# Patient Record
Sex: Male | Born: 1960 | Race: White | Hispanic: No | Marital: Married | Smoking: Former smoker
Health system: Southern US, Community
[De-identification: ages and names within clinical notes are randomized; demographics above are authoritative.]

## PROBLEM LIST (undated history)

## (undated) DIAGNOSIS — E7800 Pure hypercholesterolemia, unspecified: Secondary | ICD-10-CM

## (undated) DIAGNOSIS — E119 Type 2 diabetes mellitus without complications: Secondary | ICD-10-CM

## (undated) DIAGNOSIS — K219 Gastro-esophageal reflux disease without esophagitis: Secondary | ICD-10-CM

## (undated) DIAGNOSIS — I10 Essential (primary) hypertension: Secondary | ICD-10-CM

## (undated) HISTORY — PX: TOTAL HIP ARTHROPLASTY: SHX124

## (undated) HISTORY — DX: Essential (primary) hypertension: I10

## (undated) HISTORY — DX: Type 2 diabetes mellitus without complications: E11.9

## (undated) HISTORY — PX: SHOULDER ARTHROSCOPY WITH ROTATOR CUFF REPAIR: SHX5685

## (undated) HISTORY — DX: Pure hypercholesterolemia, unspecified: E78.00

## (undated) HISTORY — PX: WRIST FRACTURE SURGERY: SHX121

---

## 2008-08-14 ENCOUNTER — Emergency Department (HOSPITAL_COMMUNITY): Admission: EM | Admit: 2008-08-14 | Discharge: 2008-08-14 | Payer: Self-pay | Admitting: Emergency Medicine

## 2008-08-17 ENCOUNTER — Emergency Department (HOSPITAL_COMMUNITY): Admission: EM | Admit: 2008-08-17 | Discharge: 2008-08-17 | Payer: Self-pay | Admitting: Emergency Medicine

## 2008-09-21 ENCOUNTER — Emergency Department (HOSPITAL_COMMUNITY): Admission: EM | Admit: 2008-09-21 | Discharge: 2008-09-21 | Payer: Self-pay | Admitting: Emergency Medicine

## 2009-06-16 ENCOUNTER — Ambulatory Visit (HOSPITAL_COMMUNITY): Admission: RE | Admit: 2009-06-16 | Discharge: 2009-06-16 | Payer: Self-pay | Admitting: Internal Medicine

## 2009-06-20 ENCOUNTER — Ambulatory Visit (HOSPITAL_COMMUNITY): Admission: RE | Admit: 2009-06-20 | Discharge: 2009-06-20 | Payer: Self-pay | Admitting: Internal Medicine

## 2009-08-08 ENCOUNTER — Inpatient Hospital Stay (HOSPITAL_COMMUNITY)
Admission: RE | Admit: 2009-08-08 | Discharge: 2009-08-11 | Payer: Self-pay | Source: Other Acute Inpatient Hospital | Admitting: Orthopaedic Surgery

## 2010-04-11 ENCOUNTER — Emergency Department (HOSPITAL_COMMUNITY)
Admission: EM | Admit: 2010-04-11 | Discharge: 2010-04-11 | Payer: Self-pay | Source: Home / Self Care | Admitting: Emergency Medicine

## 2010-06-26 LAB — CBC
HCT: 30.7 % — ABNORMAL LOW (ref 39.0–52.0)
HCT: 44.8 % (ref 39.0–52.0)
Hemoglobin: 11.6 g/dL — ABNORMAL LOW (ref 13.0–17.0)
Hemoglobin: 11.9 g/dL — ABNORMAL LOW (ref 13.0–17.0)
MCHC: 34.5 g/dL (ref 30.0–36.0)
MCV: 98.1 fL (ref 78.0–100.0)
MCV: 98.5 fL (ref 78.0–100.0)
Platelets: 147 10*3/uL — ABNORMAL LOW (ref 150–400)
Platelets: 164 10*3/uL (ref 150–400)
Platelets: 213 10*3/uL (ref 150–400)
RBC: 3.42 MIL/uL — ABNORMAL LOW (ref 4.22–5.81)
RDW: 12.9 % (ref 11.5–15.5)
RDW: 13 % (ref 11.5–15.5)
WBC: 9.1 10*3/uL (ref 4.0–10.5)
WBC: 9.2 10*3/uL (ref 4.0–10.5)

## 2010-06-26 LAB — COMPREHENSIVE METABOLIC PANEL
AST: 25 U/L (ref 0–37)
Albumin: 4.1 g/dL (ref 3.5–5.2)
BUN: 21 mg/dL (ref 6–23)
Chloride: 108 mEq/L (ref 96–112)
Creatinine, Ser: 1 mg/dL (ref 0.4–1.5)
GFR calc Af Amer: >60 mL/min (ref >60)
Potassium: 4.2 mEq/L (ref 3.5–5.1)
Total Protein: 6.6 g/dL (ref 6.0–8.3)

## 2010-06-26 LAB — GLUCOSE, CAPILLARY
Glucose-Capillary: 104 mg/dL — ABNORMAL HIGH (ref 70–99)
Glucose-Capillary: 110 mg/dL — ABNORMAL HIGH (ref 70–99)
Glucose-Capillary: 124 mg/dL — ABNORMAL HIGH (ref 70–99)
Glucose-Capillary: 128 mg/dL — ABNORMAL HIGH (ref 70–99)
Glucose-Capillary: 141 mg/dL — ABNORMAL HIGH (ref 70–99)
Glucose-Capillary: 154 mg/dL — ABNORMAL HIGH (ref 70–99)
Glucose-Capillary: 97 mg/dL (ref 70–99)
Glucose-Capillary: 97 mg/dL (ref 70–99)

## 2010-06-26 LAB — APTT: aPTT: 29 seconds (ref 24–37)

## 2010-06-26 LAB — CROSSMATCH: Antibody Screen: NEGATIVE

## 2010-06-26 LAB — BASIC METABOLIC PANEL
BUN: 10 mg/dL (ref 6–23)
BUN: 15 mg/dL (ref 6–23)
BUN: 15 mg/dL (ref 6–23)
CO2: 27 mEq/L (ref 19–32)
CO2: 27 mEq/L (ref 19–32)
Calcium: 8.9 mg/dL (ref 8.4–10.5)
Chloride: 102 mEq/L (ref 96–112)
Creatinine, Ser: 0.91 mg/dL (ref 0.4–1.5)
Creatinine, Ser: 0.98 mg/dL (ref 0.4–1.5)
Creatinine, Ser: 1 mg/dL (ref 0.4–1.5)
GFR calc non Af Amer: >60 mL/min (ref >60)
GFR calc non Af Amer: >60 mL/min (ref >60)
Glucose, Bld: 112 mg/dL — ABNORMAL HIGH (ref 70–99)
Glucose, Bld: 129 mg/dL — ABNORMAL HIGH (ref 70–99)
Glucose, Bld: 95 mg/dL (ref 70–99)
Potassium: 3.8 mEq/L (ref 3.5–5.1)
Sodium: 135 mEq/L (ref 135–145)

## 2010-06-26 LAB — URINALYSIS, ROUTINE W REFLEX MICROSCOPIC
Glucose, UA: NEGATIVE mg/dL
Ketones, ur: 15 mg/dL — AB
Nitrite: NEGATIVE
Specific Gravity, Urine: 1.031 — ABNORMAL HIGH (ref 1.005–1.030)
pH: 5.5 (ref 5.0–8.0)

## 2010-06-26 LAB — URINE CULTURE: Colony Count: NO GROWTH

## 2010-06-26 LAB — PROTIME-INR
INR: 3.91 — ABNORMAL HIGH (ref 0.00–1.49)
Prothrombin Time: 15.7 seconds — ABNORMAL HIGH (ref 11.6–15.2)
Prothrombin Time: 33.6 seconds — ABNORMAL HIGH (ref 11.6–15.2)
Prothrombin Time: 38 seconds — ABNORMAL HIGH (ref 11.6–15.2)

## 2010-06-26 LAB — HEPATIC FUNCTION PANEL
Bilirubin, Direct: <0.1 mg/dL (ref 0.0–0.3)
Total Protein: 6 g/dL (ref 6.0–8.3)

## 2010-06-26 LAB — DIFFERENTIAL
Eosinophils Relative: 1 % (ref 0–5)
Lymphocytes Relative: 25 % (ref 12–46)
Monocytes Absolute: 0.7 10*3/uL (ref 0.1–1.0)
Monocytes Relative: 8 % (ref 3–12)
Neutro Abs: 6 10*3/uL (ref 1.7–7.7)

## 2010-07-16 LAB — URINALYSIS, ROUTINE W REFLEX MICROSCOPIC
Bilirubin Urine: NEGATIVE
Glucose, UA: NEGATIVE mg/dL
Ketones, ur: NEGATIVE mg/dL
Leukocytes, UA: NEGATIVE
Nitrite: NEGATIVE
Protein, ur: NEGATIVE mg/dL
Specific Gravity, Urine: 1.025 (ref 1.005–1.030)
Urobilinogen, UA: 0.2 mg/dL (ref 0.0–1.0)
pH: 5.5 (ref 5.0–8.0)

## 2010-07-16 LAB — URINE MICROSCOPIC-ADD ON

## 2013-01-12 ENCOUNTER — Telehealth: Payer: Self-pay

## 2013-01-12 NOTE — Telephone Encounter (Signed)
Pt was referred by Dr. Ouida Sills for screening colonoscopy. His mother had rectal cancer in her early 65's. OV with Florene Glen, NP on 02/03/2013 at 3:00 PM.

## 2013-02-03 ENCOUNTER — Ambulatory Visit (INDEPENDENT_AMBULATORY_CARE_PROVIDER_SITE_OTHER): Payer: Managed Care, Other (non HMO) | Admitting: Gastroenterology

## 2013-02-03 ENCOUNTER — Encounter (INDEPENDENT_AMBULATORY_CARE_PROVIDER_SITE_OTHER): Payer: Self-pay

## 2013-02-03 ENCOUNTER — Encounter: Payer: Self-pay | Admitting: Gastroenterology

## 2013-02-03 VITALS — BP 132/77 | HR 92 | Temp 97.2°F | Ht 68.0 in | Wt 206.2 lb

## 2013-02-03 DIAGNOSIS — Z1211 Encounter for screening for malignant neoplasm of colon: Secondary | ICD-10-CM

## 2013-02-03 DIAGNOSIS — Z800 Family history of malignant neoplasm of digestive organs: Secondary | ICD-10-CM

## 2013-02-03 MED ORDER — PEG 3350-KCL-NA BICARB-NACL 420 G PO SOLR: 4000.0000 mL | ORAL | Status: DC

## 2013-02-03 NOTE — Assessment & Plan Note (Signed)
52 year old with positive family history of colon cancer, mother diagnosed in her 71s. He has never had an initial high risk screening colonoscopy. No concerning lower or upper GI symptoms currently.   Proceed with colonoscopy with Dr. Darrick Penna in the near future. The risks, benefits, and alternatives have been discussed in detail with the patient. They state understanding and desire to proceed.

## 2013-02-03 NOTE — Patient Instructions (Signed)
We have scheduled you for a colonoscopy with Dr. Fields in the near future.  Further recommendations to follow once completed!   

## 2013-02-03 NOTE — Progress Notes (Addendum)
Primary Care Physician:  Carylon Perches, MD Primary Gastroenterologist:  Dr. Darrick Penna   Chief Complaint  Patient presents with  . Colonoscopy    HPI:   Matthew Mcdowell presents today for a visit prior to initial screening colonoscopy. He has a positive family history of colon cancer, with his mother diagnosed in her 44s. She is still living. Denies abdominal pain. Rare constipation. No rectal bleeding. No N/V. Rare reflux, will take wife's Prilosec as needed. Usually once every few weeks, specifically if eating a lot of spicy food. No dysphagia. Purposeful weight loss from the 2teens to low 200s. Around 13 pounds.   Past Medical History  Diagnosis Date  . Diabetes   . Hypertension   . Hypercholesterolemia     Past Surgical History  Procedure Laterality Date  . Total hip arthroplasty      right    Current Outpatient Prescriptions  Medication Sig Dispense Refill  . aspirin 81 MG tablet Take 81 mg by mouth daily.      Marland Kitchen lisinopril (PRINIVIL,ZESTRIL) 10 MG tablet Take 10 mg by mouth daily.       . metFORMIN (GLUCOPHAGE) 1000 MG tablet Take 1,000 mg by mouth 2 (two) times daily with a meal.       . pravastatin (PRAVACHOL) 40 MG tablet Take 40 mg by mouth daily.        No current facility-administered medications for this visit.    Allergies as of 02/03/2013  . (No Known Allergies)    Family History  Problem Relation Age of Onset  VULVAR CA RESULTING IN COLOSTOMY  Mother     Diagnosed in her 61s.  . Breast cancer Mother     History   Social History  . Marital Status: Married    Spouse Name: N/A    Number of Children: N/A  . Years of Education: N/A   Occupational History  . Not on file.   Social History Main Topics  . Smoking status: Never Smoker   . Smokeless tobacco: Not on file  . Alcohol Use: Yes     Comment: very little  . Drug Use: Yes     Comment: marijuana occasionally  . Sexual Activity: Not on file   Other Topics Concern  . Not on file   Social  History Narrative  . No narrative on file    Review of Systems: Gen: Denies any fever, chills, fatigue, weight loss, lack of appetite.  CV: Denies chest pain, heart palpitations, peripheral edema, syncope.  Resp: Denies shortness of breath at rest or with exertion. Denies wheezing or cough.  GI: see HPI GU : Denies urinary burning, urinary frequency, urinary hesitancy MS: Denies joint pain, muscle weakness, cramps, or limitation of movement.  Derm: Denies rash, itching, dry skin Psych: Denies depression, anxiety, memory loss, and confusion Heme: Denies bruising, bleeding, and enlarged lymph nodes.  Physical Exam: BP 132/77  Pulse 92  Temp(Src) 97.2 F (36.2 C) (Oral)  Ht 5\' 8"  (1.727 m)  Wt 206 lb 3.2 oz (93.532 kg)  BMI 31.36 kg/m2 General:   Alert and oriented. Pleasant and cooperative. Well-nourished and well-developed.  Head:  Normocephalic and atraumatic. Eyes:  Without icterus, sclera clear and conjunctiva pink.  Ears:  Normal auditory acuity. Nose:  No deformity, discharge,  or lesions. Mouth:  No deformity or lesions, oral mucosa pink.  Neck:  Supple, without mass or thyromegaly. Lungs:  Clear to auscultation bilaterally. No wheezes, rales, or rhonchi. No distress.  Heart:  S1,  S2 present without murmurs appreciated.  Abdomen:  +BS, soft, non-tender and non-distended. No HSM noted. No guarding or rebound. No masses appreciated.  Rectal:  Deferred  Msk:  Symmetrical without gross deformities. Normal posture. Extremities:  Without clubbing or edema. Neurologic:  Alert and  oriented x4;  grossly normal neurologically. Skin:  Intact without significant lesions or rashes. Cervical Nodes:  No significant cervical adenopathy. Psych:  Alert and cooperative. Normal mood and affect.

## 2013-02-08 NOTE — Progress Notes (Signed)
cc'd to pcp 

## 2013-02-23 ENCOUNTER — Encounter (HOSPITAL_COMMUNITY): Payer: Self-pay

## 2013-03-08 ENCOUNTER — Encounter (HOSPITAL_COMMUNITY): Payer: Self-pay | Admitting: *Deleted

## 2013-03-08 ENCOUNTER — Encounter (HOSPITAL_COMMUNITY)
Admission: RE | Disposition: A | Discharge status: Home / Self Care | Payer: Self-pay | Source: Ambulatory Visit | Attending: Gastroenterology

## 2013-03-08 ENCOUNTER — Ambulatory Visit (HOSPITAL_COMMUNITY)
Admission: RE | Admit: 2013-03-08 | Discharge: 2013-03-08 | Disposition: A | Discharge status: Home / Self Care | Payer: Managed Care, Other (non HMO) | Source: Ambulatory Visit | Attending: Gastroenterology | Admitting: Gastroenterology

## 2013-03-08 DIAGNOSIS — D126 Benign neoplasm of colon, unspecified: Secondary | ICD-10-CM

## 2013-03-08 DIAGNOSIS — Z1211 Encounter for screening for malignant neoplasm of colon: Secondary | ICD-10-CM

## 2013-03-08 DIAGNOSIS — K648 Other hemorrhoids: Secondary | ICD-10-CM

## 2013-03-08 DIAGNOSIS — Z01812 Encounter for preprocedural laboratory examination: Secondary | ICD-10-CM | POA: Insufficient documentation

## 2013-03-08 DIAGNOSIS — E7800 Pure hypercholesterolemia, unspecified: Secondary | ICD-10-CM | POA: Insufficient documentation

## 2013-03-08 DIAGNOSIS — E119 Type 2 diabetes mellitus without complications: Secondary | ICD-10-CM | POA: Insufficient documentation

## 2013-03-08 DIAGNOSIS — K5730 Diverticulosis of large intestine without perforation or abscess without bleeding: Secondary | ICD-10-CM

## 2013-03-08 DIAGNOSIS — Z800 Family history of malignant neoplasm of digestive organs: Secondary | ICD-10-CM

## 2013-03-08 DIAGNOSIS — K620 Anal polyp: Secondary | ICD-10-CM | POA: Insufficient documentation

## 2013-03-08 DIAGNOSIS — Z7982 Long term (current) use of aspirin: Secondary | ICD-10-CM | POA: Insufficient documentation

## 2013-03-08 DIAGNOSIS — I10 Essential (primary) hypertension: Secondary | ICD-10-CM | POA: Insufficient documentation

## 2013-03-08 HISTORY — PX: COLONOSCOPY: SHX5424

## 2013-03-08 SURGERY — COLONOSCOPY
Anesthesia: Moderate Sedation

## 2013-03-08 MED ORDER — MEPERIDINE HCL 100 MG/ML IJ SOLN
INTRAMUSCULAR | Status: AC
  Filled 2013-03-08: qty 2

## 2013-03-08 MED ORDER — SODIUM CHLORIDE 0.9 % IV SOLN
INTRAVENOUS | Status: DC
  Administered 2013-03-08: 09:00:00 via INTRAVENOUS

## 2013-03-08 MED ORDER — STERILE WATER FOR IRRIGATION IR SOLN
Status: DC | PRN
  Administered 2013-03-08: 09:00:00

## 2013-03-08 MED ORDER — MIDAZOLAM HCL 5 MG/5ML IJ SOLN
INTRAMUSCULAR | Status: DC | PRN
  Administered 2013-03-08: 2 mg via INTRAVENOUS
  Administered 2013-03-08: 1 mg via INTRAVENOUS
  Administered 2013-03-08: 2 mg via INTRAVENOUS

## 2013-03-08 MED ORDER — MIDAZOLAM HCL 5 MG/5ML IJ SOLN
INTRAMUSCULAR | Status: AC
  Filled 2013-03-08: qty 10

## 2013-03-08 MED ORDER — MEPERIDINE HCL 100 MG/ML IJ SOLN
INTRAMUSCULAR | Status: DC | PRN
  Administered 2013-03-08 (×3): 25 mg via INTRAVENOUS

## 2013-03-08 NOTE — Progress Notes (Signed)
REVIEWED.  

## 2013-03-08 NOTE — H&P (Addendum)
  Primary Care Physician:  Carylon Perches, MD Primary Gastroenterologist:  Dr. Darrick Penna  Pre-Procedure History & Physical: HPI:  Matthew Mcdowell is a 52 y.o. male here for COLON CANCER SCREENING: mother had VUVLAR CA AT age 54.  Past Medical History  Diagnosis Date  . Diabetes   . Hypertension   . Hypercholesterolemia    Past Surgical History  Procedure Laterality Date  . Total hip arthroplasty      right   Prior to Admission medications   Medication Sig Start Date End Date Taking? Authorizing Provider  aspirin 81 MG tablet Take 81 mg by mouth daily.   Yes Historical Provider, MD  lisinopril (PRINIVIL,ZESTRIL) 10 MG tablet Take 10 mg by mouth daily.  12/24/12  Yes Historical Provider, MD  metFORMIN (GLUCOPHAGE) 1000 MG tablet Take 1,000 mg by mouth 2 (two) times daily with a meal.  11/08/12  Yes Historical Provider, MD  pravastatin (PRAVACHOL) 40 MG tablet Take 40 mg by mouth daily.  10/30/12  Yes Historical Provider, MD   Allergies as of 02/03/2013  . (No Known Allergies)   Family History  Problem Relation Age of Onset  . Colon cancer Mother     Diagnosed in her 14s.  . Breast cancer Mother    History   Social History  . Marital Status: Married    Spouse Name: N/A    Number of Children: N/A  . Years of Education: N/A   Occupational History  . Not on file.   Social History Main Topics  . Smoking status: Never Smoker   . Smokeless tobacco: Not on file  . Alcohol Use: Yes     Comment: very little  . Drug Use: Yes     Comment: marijuana occasionally  . Sexual Activity: Not on file   Other Topics Concern  . Not on file   Social History Narrative  . No narrative on file   Review of Systems: See HPI, otherwise negative ROS  Physical Exam: BP 107/72  Pulse 83  Temp(Src) 98.5 F (36.9 C) (Oral)  Resp 18  SpO2 95% General:   Alert,  pleasant and cooperative in NAD Head:  Normocephalic and atraumatic. Neck:  Supple; Lungs:  Clear throughout to auscultation.     Heart:  Regular rate and rhythm. Abdomen:  Soft, nontender and nondistended. Normal bowel sounds, without guarding, and without rebound.   Neurologic:  Alert and  oriented x4;  grossly normal neurologically.  Impression/Plan:     SCREENING  Plan:  1. TCS TODAY

## 2013-03-08 NOTE — Op Note (Signed)
[facility] [address] [place], [address]   COLONOSCOPY PROCEDURE REPORT  PATIENT: Matthew Mcdowell, Matthew Mcdowell  MR#: [id number] BIRTHDATE: Oct 07, 1960 , 52  yrs. old GENDER: Male ENDOSCOPIST: Jonette Eva, MD REFERRED BY:Roy Ouida Sills, M.D. PROCEDURE DATE:  03/08/2013 PROCEDURE:   Colonoscopy with cold biopsy polypectomy INDICATIONS:Average risk patient for colon cancer.  PT'S MOTHER HAD VULVAR CA AT AGE 51 RESULTING IN A COLOSTOMY. MEDICATIONS: Demerol 75 mg IV and Versed 5 mg IV  DESCRIPTION OF PROCEDURE:    Physical exam was performed.  Informed consent was obtained from the patient after explaining the benefits, risks, and alternatives to procedure.  The patient was connected to monitor and placed in left lateral position. Continuous oxygen was provided by nasal cannula and IV medicine administered through an indwelling cannula.  After administration of sedation and rectal exam, the patients rectum was intubated and the EC-[id number] ([id number])  colonoscope was advanced under direct visualization to the ileum.  The scope was removed slowly by carefully examining the color, texture, anatomy, and integrity mucosa on the way out.  The patient was recovered in endoscopy and discharged home in satisfactory condition.    COLON FINDINGS: The mucosa appeared normal in the terminal ileum.  , Mild diverticulosis was noted in the ascending colon, descending colon, and sigmoid colon.  , Four sessile polyps measuring 2-4 mm in size were found in the sigmoid colon and rectum.  A polypectomy was performed with cold forceps. Small internal hemorrhoids were found.  PREP QUALITY: good.  CECAL W/D TIME: 12 minutes     COMPLICATIONS: None  ENDOSCOPIC IMPRESSION: 1.   Normal mucosa in the terminal ileum 2.   Mild diverticulosis n the ascending colon, descending colon, and sigmoid colon 3.   Four COLON  polyps REMOVED 4.   Small internal hemorrhoids  RECOMMENDATIONS: AWAIT BIOPSY HIGH  FIBER DIET TCS IN 5-10 YEARS IF SIMPLE ADENOMA AND 10 YEARS IF HYPERPLASTIC POLYPS       _______________________________ Rosalie DoctorJonette Eva, MD 03/08/2013 9:52 AM

## 2013-03-09 ENCOUNTER — Encounter (HOSPITAL_COMMUNITY): Payer: Self-pay | Admitting: Gastroenterology

## 2013-03-16 ENCOUNTER — Telehealth: Payer: Self-pay | Admitting: Gastroenterology

## 2013-03-16 NOTE — Telephone Encounter (Signed)
Please call pt. He had HYPERPLASTIC POLYPS removed from HIS colon/RECTUM.   FOLLOW A HIGH FIBER DIET. AVOID ITEMS THAT CAUSE BLOATING.   Next colonoscopy in 10 years.

## 2013-03-16 NOTE — Telephone Encounter (Signed)
LM for pt to call

## 2013-03-17 NOTE — Telephone Encounter (Signed)
Pt returned call and was informed of results.  

## 2013-09-08 ENCOUNTER — Ambulatory Visit (INDEPENDENT_AMBULATORY_CARE_PROVIDER_SITE_OTHER): Payer: BC Managed Care – PPO | Admitting: Family Medicine

## 2013-09-08 VITALS — BP 122/84 | HR 86 | Temp 98.2°F | Resp 16 | Ht 67.0 in | Wt 200.6 lb

## 2013-09-08 DIAGNOSIS — R5383 Other fatigue: Secondary | ICD-10-CM

## 2013-09-08 DIAGNOSIS — R5381 Other malaise: Secondary | ICD-10-CM

## 2013-09-08 DIAGNOSIS — T148 Other injury of unspecified body region: Secondary | ICD-10-CM

## 2013-09-08 DIAGNOSIS — W57XXXA Bitten or stung by nonvenomous insect and other nonvenomous arthropods, initial encounter: Secondary | ICD-10-CM

## 2013-09-08 MED ORDER — DOXYCYCLINE HYCLATE 100 MG PO CAPS: 100.0000 mg | ORAL_CAPSULE | Freq: Two times a day (BID) | ORAL | Status: DC

## 2013-09-08 NOTE — Progress Notes (Signed)
[facility] [address] [place] [address] [phone]  Date:  09/08/2013   Name:  Matthew Mcdowell   DOB:  August 02, 1960   MRN:  [id number]  PCP:  [name], MD    Chief Complaint: Tick Removal   History of Present Illness:  Matthew Mcdowell is a 53 y.o. very pleasant male patient who presents with the following:  He is here today regarding a tick bite.  About one week ago he noted a tick on his groin area- it was not yet swollen with blood. He pulled the tick out- he is not quite sure if a deer tick or dog tick.  He is here today because he has "not been feeling great for the last couple of days."  He has noted some aches, feeling hot and cold. He worried that this might mean he has a tick borne illness.   No antipyretics today.   He did vomit yesterday and today- once each.  His stomach feels "queasy." No diarrhea.  He does not have abdomianl pain.    History of hip replacement.  He sees Dr. Willey Blade regularly for his DM- he follows up every 3- 4 months.  Just had a CPE recetnly.  His recent DM labs have looked good per his report .     Patient Active Problem List   Diagnosis Date Noted  . Family history of colon cancer 02/03/2013    Past Medical History  Diagnosis Date  . Diabetes   . Hypertension   . Hypercholesterolemia     Past Surgical History  Procedure Laterality Date  . Total hip arthroplasty      right  . Colonoscopy N/A 03/08/2013    Procedure: COLONOSCOPY;  Surgeon: Danie Binder, MD;  Location: [facility];  Service: Endoscopy;  Laterality: N/A;  9:30    History  Substance Use Topics  . Smoking status: Never Smoker   . Smokeless tobacco: Not on file  . Alcohol Use: Yes     Comment: very little    Family History  Problem Relation Age of Onset  . Cancer Mother 71    VULVAR  . Breast cancer Mother     No Known Allergies  Medication list has been reviewed and updated.  Current Outpatient Prescriptions on File Prior to Visit   Medication Sig Dispense Refill  . aspirin 81 MG tablet Take 81 mg by mouth daily.      Marland Kitchen lisinopril (PRINIVIL,ZESTRIL) 10 MG tablet Take 10 mg by mouth daily.       . metFORMIN (GLUCOPHAGE) 1000 MG tablet Take 1,000 mg by mouth 2 (two) times daily with a meal.       . pravastatin (PRAVACHOL) 40 MG tablet Take 40 mg by mouth daily.        No current facility-administered medications on file prior to visit.    Review of Systems:  As per HPI- otherwise negative.   Physical Examination: Filed Vitals:   09/08/13 0920  BP: 122/84  Pulse: 86  Temp: 98.2 F (36.8 C)  Resp: 16   Filed Vitals:   09/08/13 0920  Height: 5\' 7"  (1.702 m)  Weight: 200 lb 9.6 oz (90.992 kg)   Body mass index is 31.41 kg/(m^2). Ideal Body Weight: Weight in (lb) to have BMI = 25: 159.3  GEN: WDWN, NAD, Non-toxic, A & O x 3, overweight, looks well otherwise  HEENT: Atraumatic, Normocephalic. Neck supple. No masses, No LAD. Ears and Nose: No external  deformity. CV: RRR, No M/G/R. No JVD. No thrill. No extra heart sounds. PULM: CTA B, no wheezes, crackles, rhonchi. No retractions. No resp. distress. No accessory muscle use. ABD: S, NT, ND No rebound. No HSM.  Benign exam EXTR: No c/c/e NEURO Normal gait.  PSYCH: Normally interactive. Conversant. Not depressed or anxious appearing.  Calm demeanor.  Right groin: there is a small red area surrounding a recent tick bite.  Appears to be a localized reaction only.   No other rash, checked hands and feet  Assessment and Plan: Tick bite - Plan: doxycycline (VIBRAMYCIN) 100 MG capsule  Other malaise and fatigue  Recent tick bite.  He is worried that he may have early signs of RMSF.  Will go ahead and cover with doxycycline.   See patient instructions for more details.     Signed Lamar Blinks, MD

## 2013-09-08 NOTE — Patient Instructions (Signed)
Ewe are going to treat you with doxycycline today to cover for possible [facility] Spotted Fever. Please keep an eye on your temperature and take the doxycycline twice a day. Assuming you feel well and do not have a fever you can stop this after a week. If you are getting worse, have fevers or any other problems please let me know Avoid excessive sun while on the doxycycline!

## 2013-09-10 ENCOUNTER — Telehealth: Payer: Self-pay

## 2013-09-10 NOTE — Telephone Encounter (Signed)
Please re-fax note, [phone]

## 2013-09-10 NOTE — Telephone Encounter (Signed)
Ok to extend note to cover today.  If patient not able to return to work after this, will need RTC for re-evaluation.

## 2013-09-10 NOTE — Telephone Encounter (Signed)
Patient was seen by Dr Lorelei Pont recently for tick bite. Received out of work note for Wednesday and Thursday but he is still not feeling better and wants a note for today (Friday). Can he get another note? Cb# [phone]

## 2013-09-10 NOTE — Telephone Encounter (Signed)
Letter has been written and faxed. Pt notified

## 2013-09-10 NOTE — Telephone Encounter (Signed)
Would like note faxed to [phone]

## 2015-04-17 ENCOUNTER — Emergency Department (HOSPITAL_COMMUNITY)
Admission: EM | Admit: 2015-04-17 | Discharge: 2015-04-17 | Disposition: A | Discharge status: Home / Self Care | Payer: Worker's Compensation | Attending: Emergency Medicine | Admitting: Emergency Medicine

## 2015-04-17 ENCOUNTER — Encounter (HOSPITAL_COMMUNITY): Payer: Self-pay | Admitting: Emergency Medicine

## 2015-04-17 DIAGNOSIS — Y9389 Activity, other specified: Secondary | ICD-10-CM | POA: Insufficient documentation

## 2015-04-17 DIAGNOSIS — S39012A Strain of muscle, fascia and tendon of lower back, initial encounter: Secondary | ICD-10-CM | POA: Diagnosis not present

## 2015-04-17 DIAGNOSIS — Z7982 Long term (current) use of aspirin: Secondary | ICD-10-CM | POA: Diagnosis not present

## 2015-04-17 DIAGNOSIS — Y990 Civilian activity done for income or pay: Secondary | ICD-10-CM | POA: Diagnosis not present

## 2015-04-17 DIAGNOSIS — X500XXA Overexertion from strenuous movement or load, initial encounter: Secondary | ICD-10-CM | POA: Insufficient documentation

## 2015-04-17 DIAGNOSIS — E119 Type 2 diabetes mellitus without complications: Secondary | ICD-10-CM | POA: Insufficient documentation

## 2015-04-17 DIAGNOSIS — E7800 Pure hypercholesterolemia, unspecified: Secondary | ICD-10-CM | POA: Insufficient documentation

## 2015-04-17 DIAGNOSIS — Y9289 Other specified places as the place of occurrence of the external cause: Secondary | ICD-10-CM | POA: Insufficient documentation

## 2015-04-17 DIAGNOSIS — S3992XA Unspecified injury of lower back, initial encounter: Secondary | ICD-10-CM | POA: Diagnosis present

## 2015-04-17 DIAGNOSIS — Z79899 Other long term (current) drug therapy: Secondary | ICD-10-CM | POA: Diagnosis not present

## 2015-04-17 DIAGNOSIS — I10 Essential (primary) hypertension: Secondary | ICD-10-CM | POA: Insufficient documentation

## 2015-04-17 MED ORDER — NAPROXEN 375 MG PO TABS: 375.0000 mg | ORAL_TABLET | Freq: Two times a day (BID) | ORAL | Status: DC

## 2015-04-17 MED ORDER — KETOROLAC TROMETHAMINE 60 MG/2ML IM SOLN
60.0000 mg | Freq: Once | INTRAMUSCULAR | Status: AC
  Administered 2015-04-17: 60 mg via INTRAMUSCULAR
  Filled 2015-04-17: qty 2

## 2015-04-17 MED ORDER — DIAZEPAM 5 MG PO TABS
10.0000 mg | ORAL_TABLET | Freq: Once | ORAL | Status: AC
  Administered 2015-04-17: 10 mg via ORAL
  Filled 2015-04-17: qty 2

## 2015-04-17 MED ORDER — DIAZEPAM 5 MG PO TABS: 5.0000 mg | ORAL_TABLET | Freq: Two times a day (BID) | ORAL | Status: DC

## 2015-04-17 NOTE — Discharge Instructions (Signed)
If you were given medicines take as directed.  If you are on coumadin or contraceptives realize their levels and effectiveness is altered by many different medicines.  If you have any reaction (rash, tongues swelling, other) to the medicines stop taking and see a physician.    If your blood pressure was elevated in the ER make sure you follow up for management with a primary doctor or return for chest pain, shortness of breath or stroke symptoms.  Please follow up as directed and return to the ER or see a physician for new or worsening symptoms.  Thank you. Filed Vitals:   04/17/15 0841  BP: 128/85  Pulse: 83  Temp: 97.5 F (36.4 C)  TempSrc: Oral  Resp: 20  Height: 5\' 8"  (1.727 m)  Weight: 207 lb (93.895 kg)  SpO2: 95%

## 2015-04-17 NOTE — ED Notes (Addendum)
Pt reports mid/lower back pain since Friday after throwing out rock salt.  Pt reports he was passing bags of salt along a line.  Pt has discomfort, denies any urinary symptoms, denies numbness tingling.

## 2015-04-17 NOTE — ED Provider Notes (Signed)
CSN: [id number]     Arrival date & time 04/17/15  [id number] History  By signing my name below, I, Emmanuella Mensah, attest that this documentation has been prepared under the direction and in the presence of Elnora Morrison, MD. Electronically Signed: Judithann Sauger, ED Scribe. 04/17/2015. 9:23 AM.    Chief Complaint  Patient presents with  . Back Pain   The history is provided by the patient. No language interpreter was used.   HPI Comments: Matthew Mcdowell is a 55 y.o. male with a hx of DM and HTN who presents to the Emergency Department complaining of ongoing moderate non-radiating lower back pain onset 3 days ago. He explains that bending and moving his neck exacerbates the pain. He reports that he unloaded 8,00 lbs of rock salt and has been working a lot to prepare for the snow prior to onset. He states that he has been taking Tylenol and Motrin since onset with no relief. Pt's last dose of motrin was 3 hours ago. He denies any weakness or numbness in legs, bowel/bladder incontinence, or urinary symptoms. He denies a hx of surgery on his legs or a hx of ulcers.   Past Medical History  Diagnosis Date  . Diabetes ([place])   . Hypertension   . Hypercholesterolemia    Past Surgical History  Procedure Laterality Date  . Total hip arthroplasty      right  . Colonoscopy N/A 03/08/2013    Procedure: COLONOSCOPY;  Surgeon: Danie Binder, MD;  Location: [facility];  Service: Endoscopy;  Laterality: N/A;  9:30   Family History  Problem Relation Age of Onset  . Cancer Mother 50    VULVAR  . Breast cancer Mother    Social History  Substance Use Topics  . Smoking status: Never Smoker   . Smokeless tobacco: None  . Alcohol Use: Yes     Comment: very little    Review of Systems  Genitourinary: Negative for dysuria and hematuria.  Musculoskeletal: Positive for back pain.  Neurological: Negative for weakness and numbness.  All other systems reviewed and are negative.     Allergies   Review of patient's allergies indicates no known allergies.  Home Medications   Prior to Admission medications   Medication Sig Start Date End Date Taking? Authorizing Provider  aspirin EC 81 MG tablet Take 81 mg by mouth daily.   Yes Historical Provider, MD  INVOKANA 300 MG TABS tablet Take 300 mg by mouth at bedtime.  04/11/15  Yes Historical Provider, MD  lisinopril (PRINIVIL,ZESTRIL) 10 MG tablet Take 10 mg by mouth at bedtime.  12/24/12  Yes Historical Provider, MD  metFORMIN (GLUCOPHAGE) 1000 MG tablet Take 1,000 mg by mouth 2 (two) times daily with a meal.  11/08/12  Yes Historical Provider, MD  pravastatin (PRAVACHOL) 40 MG tablet Take 40 mg by mouth at bedtime.  10/30/12  Yes Historical Provider, MD  doxycycline (VIBRAMYCIN) 100 MG capsule Take 1 capsule (100 mg total) by mouth 2 (two) times daily. Patient not taking: Reported on 04/17/2015 09/08/13   Gay Filler Copland, MD   BP 128/85 mmHg  Pulse 83  Temp(Src) 97.5 F (36.4 C) (Oral)  Resp 20  Ht 5\' 8"  (1.727 m)  Wt 207 lb (93.895 kg)  BMI 31.48 kg/m2  SpO2 95% Physical Exam  Constitutional: He is oriented to person, place, and time. He appears well-developed and well-nourished. No distress.  HENT:  Head: Normocephalic and atraumatic.  Eyes: Conjunctivae and EOM are normal.  Neck: Neck supple. No tracheal deviation present.  Cardiovascular: Normal rate.   Pulmonary/Chest: Effort normal. No respiratory distress.  Musculoskeletal: Normal range of motion. He exhibits tenderness.  Proximal lumbar, right side tender and tight musculature   Neurological: He is alert and oriented to person, place, and time.  Sensation intact 5/5 strength, flexion and extension  Skin: Skin is warm and dry.  Psychiatric: He has a normal mood and affect. His behavior is normal.  Nursing note and vitals reviewed.   ED Course  Procedures (including critical care time) DIAGNOSTIC STUDIES: Oxygen Saturation is 95% on RA, adequate by my interpretation.     COORDINATION OF CARE: 9:22 AM- Pt advised of plan for treatment and pt agrees. Will prescribe Toradol and Valium. Pt will receive a work note for 2 days and advised to monitor work load until pain is better. Recommended to follow up with PCP.   MDM   Final diagnoses:  Lumbar strain, initial encounter   I personally performed the services described in this documentation, which was scribed in my presence. The recorded information has been reviewed and is accurate.  Patient presents with mechanical back pain no red flags, normal neurologic exam. Discussed supportive care, work note for 2 days.  Results and differential diagnosis were discussed with the patient/parent/guardian. Xrays were independently reviewed by myself.  Close follow up outpatient was discussed, comfortable with the plan.   Medications  ketorolac (TORADOL) injection 60 mg (60 mg Intramuscular Given 04/17/15 0938)  diazepam (VALIUM) tablet 10 mg (10 mg Oral Given 04/17/15 0938)    Filed Vitals:   04/17/15 0841  BP: 128/85  Pulse: 83  Temp: 97.5 F (36.4 C)  TempSrc: Oral  Resp: 20  Height: 5\' 8"  (1.727 m)  Weight: 207 lb (93.895 kg)  SpO2: 95%    Final diagnoses:  Lumbar strain, initial encounter      Elnora Morrison, MD 04/17/15 [phone]

## 2015-04-20 ENCOUNTER — Ambulatory Visit: Payer: BLUE CROSS/BLUE SHIELD

## 2015-04-20 ENCOUNTER — Ambulatory Visit: Payer: Worker's Compensation

## 2015-04-20 ENCOUNTER — Ambulatory Visit (INDEPENDENT_AMBULATORY_CARE_PROVIDER_SITE_OTHER): Payer: Worker's Compensation | Admitting: Physician Assistant

## 2015-04-20 VITALS — BP 118/70 | HR 91 | Temp 97.7°F | Resp 18 | Ht 67.0 in | Wt 211.0 lb

## 2015-04-20 DIAGNOSIS — S39012A Strain of muscle, fascia and tendon of lower back, initial encounter: Secondary | ICD-10-CM

## 2015-04-20 LAB — POCT GLYCOSYLATED HEMOGLOBIN (HGB A1C): HEMOGLOBIN A1C: 7.7

## 2015-04-20 LAB — GLUCOSE, POCT (MANUAL RESULT ENTRY): POC GLUCOSE: 103 mg/dL — AB (ref 70–99)

## 2015-04-20 MED ORDER — CYCLOBENZAPRINE HCL 10 MG PO TABS: 5.0000 mg | ORAL_TABLET | Freq: Three times a day (TID) | ORAL | Status: DC | PRN

## 2015-04-20 MED ORDER — NAPROXEN 500 MG PO TABS
500.0000 mg | ORAL_TABLET | Freq: Two times a day (BID) | ORAL | Status: DC
Start: 2015-04-20 — End: 2016-04-27

## 2015-04-20 MED ORDER — ACETAMINOPHEN 500 MG PO TABS: 1000.0000 mg | ORAL_TABLET | Freq: Three times a day (TID) | ORAL | Status: DC | PRN

## 2015-04-20 NOTE — Progress Notes (Signed)
04/20/2015 2:37 PM   DOB: [birth year] / MRN: [id number]  SUBJECTIVE:  Matthew Mcdowell is a 55 y.o. male presenting for low mid line back pain that started roughly 7 day ago while lifting 50 lbs salt bags. He has tried 800 mg Motrin q8 for the pain and this has not helped.  He feels the pain is getting worse.  He was evaluated at [facility] 4 days ago and was told he had strained his back.  Associates muscle tightness.  He was prescribed Valium and Naprosyn 375 bid with good relief.  Denies incontinence.    Review of Systems  Constitutional: Negative for fever and chills.  Eyes: Negative for blurred vision.  Respiratory: Negative for cough and shortness of breath.   Cardiovascular: Negative for chest pain.  Gastrointestinal: Negative for nausea and abdominal pain.  Genitourinary: Negative for dysuria, urgency and frequency.  Musculoskeletal: Positive for myalgias and back pain. Negative for joint pain, falls and neck pain.  Skin: Negative for rash.  Neurological: Negative for dizziness, tingling and headaches.  Psychiatric/Behavioral: Negative for depression. The patient is not nervous/anxious.     Problem list and medications reviewed and updated by myself where necessary, and exist elsewhere in the encounter.   OBJECTIVE:  BP 118/70 mmHg  Pulse 91  Temp(Src) 97.7 F (36.5 C) (Oral)  Resp 18  Ht 5\' 7"  (1.702 m)  Wt 211 lb (95.709 kg)  BMI 33.04 kg/m2  SpO2 96%  Physical Exam  Constitutional: He is oriented to person, place, and time. He appears well-developed. He does not appear ill.  Eyes: Conjunctivae and EOM are normal. Pupils are equal, round, and reactive to light.  Cardiovascular: Normal rate.   Pulmonary/Chest: Effort normal.  Abdominal: He exhibits no distension.  Musculoskeletal: Normal range of motion.  Neurological: He is alert and oriented to person, place, and time. He has normal strength and normal reflexes. No cranial nerve deficit or sensory deficit.  Coordination and gait normal. GCS eye subscore is 4. GCS verbal subscore is 5. GCS motor subscore is 6.  Negative SLR bilaterally.   Skin: Skin is warm and dry. He is not diaphoretic.  Psychiatric: He has a normal mood and affect.  Nursing note and vitals reviewed.  [facility] reading (PRIMARY) by  PA Evi Mccomb: Negative please comment.   Results for orders placed or performed in visit on 04/20/15 (from the past 48 hour(s))  POCT glycosylated hemoglobin (Hb A1C)     Status: None   Collection Time: 04/20/15  2:02 PM  Result Value Ref Range   Hemoglobin A1C 7.7   POCT glucose (manual entry)     Status: Abnormal   Collection Time: 04/20/15  2:02 PM  Result Value Ref Range   POC Glucose 103 (A) 70 - 99 mg/dl    ASSESSMENT AND PLAN  [name] was seen today for back pain.  Diagnoses and all orders for this visit:  Back strain, initial encounter: Rads and PE normal. He is diabetic with borderline control making prednisone not the best choice for him.  Work not provided and follow up in one week.  He is to return to work on early next week with the restriction of no heavy lifting.  -     POCT glycosylated hemoglobin (Hb A1C) -     POCT glucose (manual entry) -     DG Lumbar Spine Complete; Future    The patient was advised to call or return to clinic if he does not see an improvement  in symptoms or to seek the care of the closest emergency department if he worsens with the above plan.   [name], MHS, PA-C [facility] 04/20/2015 2:37 PM

## 2015-04-20 NOTE — Patient Instructions (Signed)

## 2015-04-23 NOTE — Progress Notes (Signed)
  Medical screening examination/treatment/procedure(s) were performed by non-physician practitioner and as supervising physician I was immediately available for consultation/collaboration.     

## 2015-04-27 ENCOUNTER — Ambulatory Visit (INDEPENDENT_AMBULATORY_CARE_PROVIDER_SITE_OTHER): Payer: Worker's Compensation | Admitting: Family Medicine

## 2015-04-27 VITALS — BP 128/84 | HR 96 | Temp 97.8°F | Resp 18 | Ht 67.0 in | Wt 208.0 lb

## 2015-04-27 DIAGNOSIS — S39012A Strain of muscle, fascia and tendon of lower back, initial encounter: Secondary | ICD-10-CM

## 2015-04-27 NOTE — Patient Instructions (Signed)
Return if pain recurs

## 2015-04-27 NOTE — Progress Notes (Signed)
   By signing my name below, I, Matthew Mcdowell, attest that this documentation has been prepared under the direction and in the presence of [name], MD. Electronically Signed: [name], [place]. 04/27/2015 , 10:10 AM .  Patient was seen in room 9 .   Patient ID: Matthew Mcdowell MRN: [id number], DOB: 09/12/1960, 55 y.o. Date of Encounter: 04/27/2015  Primary Physician: [name], MD  Chief Complaint:  Chief Complaint  Patient presents with  . Follow-up    WC    HPI:  Matthew Mcdowell is a 55 y.o. male who presents to [facility] for worker's comp follow up. He states that he feels much better. He was seen by [name] a week ago for back strain. He noticed back pain while lifting 50 lbs salt bags.   He works at [employer].   Allergies: No Known Allergies    Review of Systems: Constitutional: negative for fever, chills, night sweats, weight changes, or fatigue  HEENT: negative for vision changes, hearing loss, congestion, rhinorrhea, ST, epistaxis, or sinus pressure Cardiovascular: negative for chest pain or palpitations Respiratory: negative for hemoptysis, wheezing, shortness of breath, or cough Abdominal: negative for abdominal pain, nausea, vomiting, diarrhea, or constipation Dermatological: negative for rash Neurologic: negative for headache, dizziness, or syncope Musc: negative for back pain All other systems reviewed and are otherwise negative with the exception to those above and in the HPI.  Physical Exam: Mcdowell pressure 128/84, pulse 96, temperature 97.8 F (36.6 C), resp. rate 18, height 5\' 7"  (1.702 m), weight 208 lb (94.348 kg), SpO2 95 %., Body mass index is 32.57 kg/(m^2). General: Well developed, well nourished, in no acute distress. Head: Normocephalic, atraumatic, eyes without discharge, sclera non-icteric, nares are without discharge. Bilateral auditory canals clear, TM's are without perforation, pearly grey and translucent with  reflective cone of light bilaterally. Oral cavity moist, posterior pharynx without exudate, erythema, peritonsillar abscess, or post nasal drip.  Neck: Supple. No thyromegaly. Full ROM. No lymphadenopathy. Lungs: Clear bilaterally to auscultation without wheezes, rales, or rhonchi. Breathing is unlabored. Heart: RRR with S1 S2. No murmurs, rubs, or gallops appreciated. Msk:  Strength and tone normal for age. Extremities/Skin: Warm and dry. No clubbing or cyanosis. No edema. No rashes or suspicious lesions. Full ROM of his back.  Normal SLR and back inspection and palpation Neuro: Alert and oriented X 3. Moves all extremities spontaneously. Gait is normal. CNII-XII grossly in tact. Psych:  Responds to questions appropriately with a normal affect.     ASSESSMENT AND PLAN:  55 y.o. year old male with  This chart was scribed in my presence and reviewed by me personally.    ICD-9-CM ICD-10-CM   1. Back strain, initial encounter [id number]      Signed, [name], MD     Signed, [name], MD 04/27/2015 10:10 AM

## 2015-05-18 ENCOUNTER — Ambulatory Visit (INDEPENDENT_AMBULATORY_CARE_PROVIDER_SITE_OTHER): Payer: BLUE CROSS/BLUE SHIELD | Admitting: Physician Assistant

## 2015-05-18 VITALS — BP 120/70 | HR 94 | Temp 98.3°F | Resp 20 | Ht 66.54 in | Wt 207.6 lb

## 2015-05-18 DIAGNOSIS — R0789 Other chest pain: Secondary | ICD-10-CM

## 2015-05-18 DIAGNOSIS — J209 Acute bronchitis, unspecified: Secondary | ICD-10-CM

## 2015-05-18 MED ORDER — IPRATROPIUM BROMIDE 0.02 % IN SOLN
0.5000 mg | Freq: Once | RESPIRATORY_TRACT | Status: AC
  Administered 2015-05-18: 0.5 mg via RESPIRATORY_TRACT

## 2015-05-18 MED ORDER — AZITHROMYCIN 250 MG PO TABS: ORAL_TABLET | ORAL | Status: AC

## 2015-05-18 MED ORDER — BENZONATATE 100 MG PO CAPS: 100.0000 mg | ORAL_CAPSULE | Freq: Three times a day (TID) | ORAL | Status: DC | PRN

## 2015-05-18 MED ORDER — ALBUTEROL SULFATE (2.5 MG/3ML) 0.083% IN NEBU
2.5000 mg | INHALATION_SOLUTION | Freq: Once | RESPIRATORY_TRACT | Status: AC
  Administered 2015-05-18: 2.5 mg via RESPIRATORY_TRACT

## 2015-05-18 MED ORDER — HYDROCOD POLST-CPM POLST ER 10-8 MG/5ML PO SUER: 5.0000 mL | Freq: Two times a day (BID) | ORAL | Status: DC | PRN

## 2015-05-18 NOTE — Patient Instructions (Signed)
Drink plenty of water (64 oz/day) and get plenty of rest. If you have been prescribed a cough syrup, do not drive or operate heavy machinery while using this medication. Take tessalon during the day Take zpak as directed If your symptoms are not improving in 7-10 days, return to clinic.

## 2015-05-18 NOTE — Progress Notes (Signed)
[facility] [address], [place] [address] [phone]  Date:  05/18/2015   Name:  Matthew Mcdowell   DOB:  1960-12-02   MRN:  [id number]  PCP:  [name], MD    Chief Complaint: Cough   History of Present Illness:  This is a 55 y.o. male with PMH DM2, HLD, HTN who is presenting with cough x 4 days. Having intermittent nasal congestion. Having mild chest tightness. No sob or wheezing.   Otalgia: no Sore throat: mild only with coughing Fever/chills: no Aggravating/alleviating factors: tried dayquil which helped some. Took wife's left over flowtuss last night and did not help his sleep. History of asthma: no History of env allergies: no Tobacco use: former smoker, quit 15 years ago. Wife was sick but better now.  Review of Systems:  Review of Systems See HPI  Patient Active Problem List   Diagnosis Date Noted  . Family history of colon cancer 02/03/2013    Prior to Admission medications   Medication Sig Start Date End Date Taking? Authorizing Provider  aspirin EC 81 MG tablet Take 81 mg by mouth daily.   Yes Historical Provider, MD  INVOKANA 300 MG TABS tablet Take 300 mg by mouth at bedtime.  04/11/15  Yes Historical Provider, MD  lisinopril (PRINIVIL,ZESTRIL) 10 MG tablet Take 10 mg by mouth at bedtime.  12/24/12  Yes Historical Provider, MD  metFORMIN (GLUCOPHAGE) 1000 MG tablet Take 1,000 mg by mouth 2 (two) times daily with a meal.  11/08/12  Yes Historical Provider, MD  naproxen (NAPROSYN) 500 MG tablet Take 1 tablet (500 mg total) by mouth 2 (two) times daily with a meal. 04/20/15  Yes Tereasa Coop, PA-C  pravastatin (PRAVACHOL) 40 MG tablet Take 40 mg by mouth at bedtime.  10/30/12  Yes Historical Provider, MD  cyclobenzaprine (FLEXERIL) 10 MG tablet Take 0.5 tablets (5 mg total) by mouth 3 (three) times daily as needed for muscle spasms. Patient not taking: Reported on 04/27/2015 04/20/15   Tereasa Coop, PA-C  diazepam (VALIUM) 5 MG tablet Take  1 tablet (5 mg total) by mouth 2 (two) times daily. Patient not taking: Reported on 04/27/2015 04/17/15   Elnora Morrison, MD    No Known Allergies  Past Surgical History  Procedure Laterality Date  . Total hip arthroplasty      right  . Colonoscopy N/A 03/08/2013    Procedure: COLONOSCOPY;  Surgeon: Danie Binder, MD;  [facility];  Service: Endoscopy;  Laterality: N/A;  9:30    Social History  Substance Use Topics  . Smoking status: Former Research scientist (life sciences)  . Smokeless tobacco: None  . Alcohol Use: 0.0 oz/week    0 Standard drinks or equivalent per week     Comment: very little    Family History  Problem Relation Age of Onset  . Cancer Mother 79    VULVAR  . Breast cancer Mother     Medication list has been reviewed and updated.  Physical Examination:  Physical Exam  Constitutional: He is oriented to person, place, and time. He appears well-developed and well-nourished. No distress.  HENT:  Head: Normocephalic and atraumatic.  Right Ear: Hearing, tympanic membrane, external ear and ear canal normal.  Left Ear: Hearing, tympanic membrane, external ear and ear canal normal.  Nose: Nose normal.  Mouth/Throat: Uvula is midline, oropharynx is clear and moist and mucous membranes are normal.  Eyes: Conjunctivae and lids are normal. Right eye exhibits no discharge.  Left eye exhibits no discharge. No scleral icterus.  Cardiovascular: Normal rate, regular rhythm, normal heart sounds and normal pulses.   No murmur heard. Pulmonary/Chest: Effort normal and breath sounds normal. No respiratory distress. He has no wheezes. He has no rhonchi. He has no rales.  Musculoskeletal: Normal range of motion.  Lymphadenopathy:       Head (right side): No submental, no submandibular and no tonsillar adenopathy present.       Head (left side): No submental, no submandibular and no tonsillar adenopathy present.    He has no cervical adenopathy.  Neurological: He is alert and oriented to  person, place, and time.  Skin: Skin is warm, dry and intact. No lesion and no rash noted.  Psychiatric: He has a normal mood and affect. His speech is normal and behavior is normal. Thought content normal.   BP 120/70 mmHg  Pulse 94  Temp(Src) 98.3 F (36.8 C) (Oral)  Resp 20  Ht 5' 6.53" (1.69 m)  Wt 207 lb 9.6 oz (94.167 kg)  BMI 32.97 kg/m2  SpO2 94%  Pulse ox 92-94. Same after duoneb treatment  Assessment and Plan:  1. Acute bronchitis, unspecified organism 2. Chest tightness  Treat with abx d/t pulse ox 92-94. No improvement after duoneb. Exam fairly unremarkable. Treat with zpak, tessalon, tussionex. Return in 1 week if symptoms do not improve or at any time if symptoms worsen.  - chlorpheniramine-HYDROcodone (TUSSIONEX PENNKINETIC ER) 10-8 MG/5ML SUER; Take 5 mLs by mouth every 12 (twelve) hours as needed for cough.  Dispense: 100 mL; Refill: 0 - azithromycin (ZITHROMAX) 250 MG tablet; Take 2 tabs PO x 1 dose, then 1 tab PO QD x 4 days  Dispense: 6 tablet; Refill: 0 - benzonatate (TESSALON) 100 MG capsule; Take 1-2 capsules (100-200 mg total) by mouth 3 (three) times daily as needed for cough.  Dispense: 40 capsule; Refill: 0 - albuterol (PROVENTIL) (2.5 MG/3ML) 0.083% nebulizer solution 2.5 mg; Take 3 mLs (2.5 mg total) by nebulization once. - ipratropium (ATROVENT) nebulizer solution 0.5 mg; Take 2.5 mLs (0.5 mg total) by nebulization once.   Benjaman Pott Drenda Freeze, MHS [facility]  05/18/2015

## 2016-04-27 ENCOUNTER — Telehealth: Payer: Self-pay | Admitting: *Deleted

## 2016-04-27 ENCOUNTER — Ambulatory Visit (INDEPENDENT_AMBULATORY_CARE_PROVIDER_SITE_OTHER): Payer: 59 | Admitting: Family Medicine

## 2016-04-27 VITALS — BP 122/74 | HR 104 | Temp 97.5°F | Resp 17 | Ht 67.5 in | Wt 213.0 lb

## 2016-04-27 DIAGNOSIS — J0100 Acute maxillary sinusitis, unspecified: Secondary | ICD-10-CM

## 2016-04-27 MED ORDER — AZITHROMYCIN 250 MG PO TABS: ORAL_TABLET | ORAL | 0 refills | Status: DC

## 2016-04-27 NOTE — Patient Instructions (Addendum)
  You do have a sinus infection.  Take the azithromycin 2 pills today and 1 pill daily after that until gone.  If you're having any worsening symptoms come back and see [place] or go to the ED after hours.  It was good to meet you today   IF you received an x-ray today, you will receive an invoice from [facility]. Please contact [facility] at [phone] with questions or concerns regarding your invoice.   IF you received labwork today, you will receive an invoice from [place]. Please contact [facility] at [phone] with questions or concerns regarding your invoice.   Our billing staff will not be able to assist you with questions regarding bills from these companies.  You will be contacted with the lab results as soon as they are available. The fastest way to get your results is to activate your My Chart account. Instructions are located on the last page of this paperwork. If you have not heard from [place] regarding the results in 2 weeks, please contact this office.

## 2016-04-27 NOTE — Progress Notes (Signed)
   SUBJECTIVE: URI symptoms:  Matthew Mcdowell is a 56 y.o. male who complains of URI symptoms present for past week days.  Describes rhinorrhea, sinus congestion, mild cough.  Sinus congestion has continued to worsen despite multiple OTC decongestants. Sick contacts are none that he knows of.  No fevers or chills. No nausea or vomiting.  Denies smoking cigarettes.  ROS as above.    PMH reviewed. Patient is a nonsmoker.   Medications reviewed.  Physical Exam:  BP 122/74 (BP Location: Right Arm, Patient Position: Sitting, Cuff Size: Normal)   Pulse (!) 104   Temp 97.5 F (36.4 C) (Oral)   Resp 17   Ht 5' 7.5" (1.715 m)   Wt 213 lb (96.6 kg)   SpO2 95%   BMI 32.87 kg/m  Gen:  Patient sitting on exam table, appears stated age in no acute distress Head: Normocephalic atraumatic Eyes: EOMI, PERRL, sclera and conjunctiva non-erythematous Ears:  Canals clear bilaterally.  TMs pearly gray bilaterally without erythema or bulging.   Nose:  Nasal turbinates grossly enlarged bilaterally. Some exudates noted. Tender to palpation of maxillary sinus  Mouth: Mucosa membranes moist. Tonsils +2, nonenlarged, non-erythematous. Neck: No cervical lymphadenopathy noted Heart:  RRR, no murmurs auscultated. Pulm:  Clear to auscultation bilaterally with good air movement.  No wheezes or rales noted.    Assessment and Plan:  1.  Sinus infection: - azithromycin as per AVS - FU if no improvement in next week.  Sooner if worsening.

## 2016-04-27 NOTE — Addendum Note (Signed)
Addended byMingo Amber, Keatyn Jawad H on: 04/27/2016 11:16 AM   Modules accepted: Orders

## 2016-04-27 NOTE — Telephone Encounter (Signed)
Spoke with Biochemist, clinical at [place], Linna Hoff. Rx for Azithromycin cancelled sent to the wrong pharmacy. Dr. Mingo Amber to send to [facility], [address].

## 2016-07-29 ENCOUNTER — Ambulatory Visit (INDEPENDENT_AMBULATORY_CARE_PROVIDER_SITE_OTHER): Payer: 59 | Admitting: Physician Assistant

## 2016-07-29 VITALS — BP 125/77 | HR 91 | Temp 98.5°F | Resp 18 | Ht 67.5 in | Wt 202.8 lb

## 2016-07-29 DIAGNOSIS — W57XXXA Bitten or stung by nonvenomous insect and other nonvenomous arthropods, initial encounter: Secondary | ICD-10-CM | POA: Diagnosis not present

## 2016-07-29 DIAGNOSIS — R21 Rash and other nonspecific skin eruption: Secondary | ICD-10-CM | POA: Diagnosis not present

## 2016-07-29 MED ORDER — DOXYCYCLINE HYCLATE 100 MG PO CAPS: 100.0000 mg | ORAL_CAPSULE | Freq: Two times a day (BID) | ORAL | 0 refills | Status: DC

## 2016-07-29 MED ORDER — DOXYCYCLINE HYCLATE 100 MG PO CAPS
100.0000 mg | ORAL_CAPSULE | Freq: Two times a day (BID) | ORAL | 0 refills | Status: AC
Start: 2016-07-29 — End: 2016-08-08

## 2016-07-29 NOTE — Progress Notes (Signed)
PRIMARY CARE AT [facility] [address] [place] [address] [phone]  Date:  07/29/2016   Name:  Matthew Mcdowell   DOB:  October 07, 1960   MRN:  [id number]  PCP:  Matthew Noble, MD    History of Present Illness:  Matthew Mcdowell is a 56 y.o. male patient who presents to PCP with  Chief Complaint  Patient presents with  . Generalized Body Aches    was bite by a tick on right side x1day     Patient reports that he found a tick on his right side yesterday morning.  He successfully removed the tick.  Today he started to feel a general malaise and body aches.  He has no ur symptoms, dizziness, weakness, or headache.  No rashing, abdominal pain, or nausea.  [place] has been outside performing yard work.  He found a tick 2 days ago, which was removed by his wife.  No target lesion reported. \ Hx of rsmf  Patient Active Problem List   Diagnosis Date Noted  . Family history of colon cancer 02/03/2013    Past Medical History:  Diagnosis Date  . Diabetes ([place])   . Hypercholesterolemia   . Hypertension     Past Surgical History:  Procedure Laterality Date  . COLONOSCOPY N/A 03/08/2013   Procedure: COLONOSCOPY;  Surgeon: Matthew Binder, MD;  [facility];  Service: Endoscopy;  Laterality: N/A;  9:30  . TOTAL HIP ARTHROPLASTY     right    Social History  Substance Use Topics  . Smoking status: Former Research scientist (life sciences)  . Smokeless tobacco: Never Used  . Alcohol use 0.0 oz/week     Comment: very little    Family History  Problem Relation Age of Onset  . Cancer Mother 46    VULVAR  . Breast cancer Mother     No Known Allergies  Medication list has been reviewed and updated.  Current Outpatient Prescriptions on File Prior to Visit  Medication Sig Dispense Refill  . aspirin EC 81 MG tablet Take 81 mg by mouth daily.    Marland Kitchen atorvastatin (LIPITOR) 20 MG tablet Take 20 mg by mouth daily.    Marland Kitchen azithromycin (ZITHROMAX) 250 MG tablet Take 2 pills today and then 1  pill daily after that. 6 tablet 0  . metFORMIN (GLUCOPHAGE) 1000 MG tablet Take 1,000 mg by mouth 2 (two) times daily with a meal.     . lisinopril (PRINIVIL,ZESTRIL) 10 MG tablet Take 10 mg by mouth at bedtime.      No current facility-administered medications on file prior to visit.     ROS ROS otherwise unremarkable unless listed above.  Physical Examination: BP 125/77   Pulse 91   Temp 98.5 F (36.9 C) (Oral)   Resp 18   Ht 5' 7.5" (1.715 m)   Wt 202 lb 12.8 oz (92 kg)   SpO2 94%   BMI 31.29 kg/m  Ideal Body Weight: Weight in (lb) to have BMI = 25: 161.7  Physical Exam  Constitutional: He is oriented to person, place, and time. He appears well-developed and well-nourished. No distress.  HENT:  Head: Normocephalic and atraumatic.  Eyes: Conjunctivae and EOM are normal. Pupils are equal, round, and reactive to light.  Cardiovascular: Normal rate, regular rhythm, normal heart sounds and intact distal pulses.   Pulmonary/Chest: Effort normal and breath sounds normal. No respiratory distress. He has no wheezes.  Neurological: He is alert and oriented to person, place,  and time.  Skin: Skin is warm and dry. He is not diaphoretic.  Right flank position with erythematous patch about 4cm in length.  No bull's eye quality.  Pin point puncture site visualized without the appearance of foreign body.   Psychiatric: He has a normal mood and affect. His behavior is normal.     Assessment and Plan: Matthew Mcdowell is a 56 y.o. male who is here today for cc of tick bite Proceed with doxycycline.  rtc as needed. Tick bite, initial encounter - Plan: doxycycline (VIBRAMYCIN) 100 MG capsule, DISCONTINUED: doxycycline (VIBRAMYCIN) 100 MG capsule  Erythematous rash - Plan: doxycycline (VIBRAMYCIN) 100 MG capsule, DISCONTINUED: doxycycline (VIBRAMYCIN) 100 MG capsule  Matthew Drape, PA-C [facility] 4/25/20188:31 AM

## 2016-07-29 NOTE — Patient Instructions (Addendum)
Tick Bite Information Introduction Ticks are insects that attach themselves to the skin. There are many types of ticks. Common types include wood ticks and deer ticks. Sometimes, ticks carry diseases that can make a person very ill. The most common places for ticks to attach themselves are the scalp, neck, armpits, waist, and groin. HOW CAN YOU PREVENT TICK BITES? Take these steps to help prevent tick bites when you are outdoors:  Wear long sleeves and long pants.  Wear white clothes so you can see ticks more easily.  Tuck your pant legs into your socks.  If walking on a trail, stay in the middle of the trail to avoid brushing against bushes.  Avoid walking through areas with long grass.  Put bug spray on all skin that is showing and along boot tops, pant legs, and sleeve cuffs.  Check clothes, hair, and skin often and before going inside.  Brush off any ticks that are not attached.  Take a shower or bath as soon as possible after being outdoors. HOW SHOULD YOU REMOVE A TICK? Ticks should be removed as soon as possible to help prevent diseases. 1. If latex gloves are available, put them on before trying to remove a tick. 2. Use tweezers to grasp the tick as close to the skin as possible. You may also use curved forceps or a tick removal tool. Grasp the tick as close to its head as possible. Avoid grasping the tick on its body. 3. Pull gently upward until the tick lets go. Do not twist the tick or jerk it suddenly. This may break off the tick's head or mouth parts. 4. Do not squeeze or crush the tick's body. This could force disease-carrying fluids from the tick into your body. 5. After the tick is removed, wash the bite area and your hands with soap and water or alcohol. 6. Apply a small amount of antiseptic cream or ointment to the bite site. 7. Wash any tools that were used. Do not try to remove a tick by applying a hot match, petroleum jelly, or fingernail polish to the tick. These  methods do not work. They may also increase the chances of disease being spread from the tick bite. WHEN SHOULD YOU SEEK HELP? Contact your health care provider if you are unable to remove a tick or if a part of the tick breaks off in the skin. After a tick bite, you need to watch for signs and symptoms of diseases that can be spread by ticks. Contact your health care provider if you develop any of the following:  Fever.  Rash.  Redness and puffiness (swelling) in the area of the tick bite.  Tender, puffy lymph glands.  Watery poop (diarrhea).  Weight loss.  Cough.  Feeling more tired than normal (fatigue).  Muscle, joint, or bone pain.  Belly (abdominal) pain.  Headache.  Change in your level of consciousness.  Trouble walking or moving your legs.  Loss of feeling (numbness) in the legs.  Loss of movement (paralysis).  Shortness of breath.  Confusion.  Throwing up (vomiting) many times. This information is not intended to replace advice given to you by your health care provider. Make sure you discuss any questions you have with your health care provider. Document Released: 06/19/2009 Document Revised: 08/31/2015 Document Reviewed: 09/02/2012 Elsevier Interactive Patient Education  2017 [employer].     IF you received an x-ray today, you will receive an invoice from [facility]. Please contact [facility] at [phone]  with questions or concerns regarding your invoice.   IF you received labwork today, you will receive an invoice from [place]. Please contact [facility] at [phone] with questions or concerns regarding your invoice.   Our billing staff will not be able to assist you with questions regarding bills from these companies.  You will be contacted with the lab results as soon as they are available. The fastest way to get your results is to activate your My Chart account. Instructions are located on the last page of this paperwork.  If you have not heard from [place] regarding the results in 2 weeks, please contact this office.

## 2017-07-09 ENCOUNTER — Encounter: Payer: Self-pay | Admitting: Physician Assistant

## 2018-01-01 ENCOUNTER — Other Ambulatory Visit (HOSPITAL_COMMUNITY): Payer: Self-pay | Admitting: Internal Medicine

## 2018-01-01 ENCOUNTER — Ambulatory Visit (HOSPITAL_COMMUNITY)
Admission: RE | Admit: 2018-01-01 | Discharge: 2018-01-01 | Disposition: A | Discharge status: Home / Self Care | Payer: Self-pay | Source: Ambulatory Visit | Attending: Internal Medicine | Admitting: Internal Medicine

## 2018-01-01 DIAGNOSIS — R52 Pain, unspecified: Secondary | ICD-10-CM

## 2018-01-07 ENCOUNTER — Emergency Department (HOSPITAL_COMMUNITY)
Admission: EM | Admit: 2018-01-07 | Discharge: 2018-01-07 | Disposition: A | Discharge status: Home / Self Care | Payer: 59 | Attending: Emergency Medicine | Admitting: Emergency Medicine

## 2018-01-07 ENCOUNTER — Other Ambulatory Visit: Payer: Self-pay

## 2018-01-07 ENCOUNTER — Encounter (HOSPITAL_COMMUNITY): Payer: Self-pay | Admitting: Emergency Medicine

## 2018-01-07 DIAGNOSIS — Z96641 Presence of right artificial hip joint: Secondary | ICD-10-CM | POA: Diagnosis not present

## 2018-01-07 DIAGNOSIS — Z87891 Personal history of nicotine dependence: Secondary | ICD-10-CM | POA: Diagnosis not present

## 2018-01-07 DIAGNOSIS — Z79899 Other long term (current) drug therapy: Secondary | ICD-10-CM | POA: Insufficient documentation

## 2018-01-07 DIAGNOSIS — R109 Unspecified abdominal pain: Secondary | ICD-10-CM | POA: Diagnosis present

## 2018-01-07 DIAGNOSIS — I10 Essential (primary) hypertension: Secondary | ICD-10-CM | POA: Diagnosis not present

## 2018-01-07 DIAGNOSIS — Z7982 Long term (current) use of aspirin: Secondary | ICD-10-CM | POA: Diagnosis not present

## 2018-01-07 DIAGNOSIS — Z7984 Long term (current) use of oral hypoglycemic drugs: Secondary | ICD-10-CM | POA: Insufficient documentation

## 2018-01-07 DIAGNOSIS — E119 Type 2 diabetes mellitus without complications: Secondary | ICD-10-CM | POA: Diagnosis not present

## 2018-01-07 LAB — CBC WITH DIFFERENTIAL/PLATELET
ABS IMMATURE GRANULOCYTES: 0.1 10*3/uL (ref 0.0–0.1)
BASOS ABS: 0.1 10*3/uL (ref 0.0–0.1)
Basophils Relative: 1 %
Eosinophils Absolute: 0.2 10*3/uL (ref 0.0–0.7)
Eosinophils Relative: 1 %
HCT: 47.6 % (ref 39.0–52.0)
HEMOGLOBIN: 15.6 g/dL (ref 13.0–17.0)
Immature Granulocytes: 1 %
LYMPHS PCT: 28 %
Lymphs Abs: 3 10*3/uL (ref 0.7–4.0)
MCH: 33.1 pg (ref 26.0–34.0)
MCHC: 32.8 g/dL (ref 30.0–36.0)
MCV: 100.8 fL — ABNORMAL HIGH (ref 78.0–100.0)
Monocytes Absolute: 1.1 10*3/uL — ABNORMAL HIGH (ref 0.1–1.0)
Monocytes Relative: 11 %
NEUTROS ABS: 6.4 10*3/uL (ref 1.7–7.7)
Neutrophils Relative %: 58 %
PLATELETS: 195 10*3/uL (ref 150–400)
RBC: 4.72 MIL/uL (ref 4.22–5.81)
RDW: 12.9 % (ref 11.5–15.5)
WBC: 10.9 10*3/uL — AB (ref 4.0–10.5)

## 2018-01-07 LAB — COMPREHENSIVE METABOLIC PANEL
ALK PHOS: 47 U/L (ref 38–126)
ALT: 26 U/L (ref 0–44)
AST: 23 U/L (ref 15–41)
Albumin: 3.6 g/dL (ref 3.5–5.0)
Anion gap: 11 (ref 5–15)
BUN: 18 mg/dL (ref 6–20)
CALCIUM: 8.8 mg/dL — AB (ref 8.9–10.3)
CO2: 20 mmol/L — AB (ref 22–32)
CREATININE: 0.91 mg/dL (ref 0.61–1.24)
Chloride: 106 mmol/L (ref 98–111)
GFR calc non Af Amer: >60 mL/min (ref >60)
GLUCOSE: 122 mg/dL — AB (ref 70–99)
Potassium: 4.1 mmol/L (ref 3.5–5.1)
SODIUM: 137 mmol/L (ref 135–145)
Total Bilirubin: 0.7 mg/dL (ref 0.3–1.2)
Total Protein: 5.8 g/dL — ABNORMAL LOW (ref 6.5–8.1)

## 2018-01-07 LAB — URINALYSIS, ROUTINE W REFLEX MICROSCOPIC
BACTERIA UA: NONE SEEN
Bilirubin Urine: NEGATIVE
Ketones, ur: 5 mg/dL — AB
Leukocytes, UA: NEGATIVE
NITRITE: NEGATIVE
PH: 5 (ref 5.0–8.0)
PROTEIN: NEGATIVE mg/dL
Specific Gravity, Urine: 1.027 (ref 1.005–1.030)

## 2018-01-07 LAB — CBG MONITORING, ED: Glucose-Capillary: 118 mg/dL — ABNORMAL HIGH (ref 70–99)

## 2018-01-07 MED ORDER — KETOROLAC TROMETHAMINE 15 MG/ML IJ SOLN
15.0000 mg | Freq: Once | INTRAMUSCULAR | Status: AC
  Administered 2018-01-07: 15 mg via INTRAVENOUS
  Filled 2018-01-07: qty 1

## 2018-01-07 MED ORDER — HYDROCODONE-ACETAMINOPHEN 5-325 MG PO TABS: 2.0000 | ORAL_TABLET | ORAL | 0 refills | Status: DC | PRN

## 2018-01-07 MED ORDER — TAMSULOSIN HCL 0.4 MG PO CAPS: 0.4000 mg | ORAL_CAPSULE | Freq: Every day | ORAL | 0 refills | Status: DC

## 2018-01-07 NOTE — ED Provider Notes (Signed)
[facility] EMERGENCY DEPARTMENT Provider Note   CSN: [id number] Arrival date & time: 01/07/18  0551  History   Chief Complaint Chief Complaint  Patient presents with  . Flank Pain   HPI Matthew Mcdowell is a 57 y.o. male with a past medical history significant for DM, hypertension, and hypercholesterolemia who presents for evaluation of left flank pain. Pain started yesterday evening around 11pm. Pain has been intermittent in nature. Pain radiated into his left lower lower abdomen.  His pain at approximately 2 AM this morning was a 10/10.  However states his pain is currently a 0/10. States he has been taking Motrin with significant relief of his symptoms.  Patient states "I think I might have passed a stone."  Denies fever, chills, nausea, vomiting, chest pain, diarrhea, constipation, testicular pain, dysuria, hematuria.  Admits to history of kidney stones.  Has not needed any intervention in the past for his stones.  Denies ever needing to see urology.  HPI  Past Medical History:  Diagnosis Date  . Diabetes ([place])   . Hypercholesterolemia   . Hypertension     Patient Active Problem List   Diagnosis Date Noted  . Family history of colon cancer 02/03/2013    Past Surgical History:  Procedure Laterality Date  . COLONOSCOPY N/A 03/08/2013   Procedure: COLONOSCOPY;  Surgeon: Danie Binder, MD;  Location: [facility];  Service: Endoscopy;  Laterality: N/A;  9:30  . TOTAL HIP ARTHROPLASTY     right      Home Medications    Prior to Admission medications   Medication Sig Start Date End Date Taking? Authorizing Provider  aspirin EC 81 MG tablet Take 81 mg by mouth daily.   Yes [provider]  atorvastatin (LIPITOR) 20 MG tablet Take 20 mg by mouth daily.   Yes [provider]  canagliflozin (INVOKANA) 300 MG TABS tablet Take 300 mg by mouth daily.    Yes [provider]  lisinopril (PRINIVIL,ZESTRIL) 10 MG tablet Take 10 mg by mouth  at bedtime.  12/24/12  Yes [provider]  metFORMIN (GLUCOPHAGE) 1000 MG tablet Take 1,000 mg by mouth 2 (two) times daily with a meal.  11/08/12  Yes [provider]  Probiotic Product (PROBIOTIC PO) Take 1 capsule by mouth daily.   Yes [provider]  HYDROcodone-acetaminophen (NORCO/VICODIN) 5-325 MG tablet Take 2 tablets by mouth every 4 (four) hours as needed. 01/07/18   Keyandra Swenson A, PA-C  tamsulosin (FLOMAX) 0.4 MG CAPS capsule Take 1 capsule (0.4 mg total) by mouth daily. 01/07/18   Maximino Cozzolino A, PA-C    Family History Family History  Problem Relation Age of Onset  . Cancer Mother 64       VULVAR  . Breast cancer Mother     Social History Social History   Tobacco Use  . Smoking status: Former Research scientist (life sciences)  . Smokeless tobacco: Never Used  Substance Use Topics  . Alcohol use: Yes    Alcohol/week: 0.0 standard drinks    Comment: very little  . Drug use: No     Allergies   Patient has no known allergies.   Review of Systems Review of Systems  Constitutional: Negative for activity change, appetite change, chills, diaphoresis, fatigue, fever and unexpected weight change.  Respiratory: Negative for chest tightness and shortness of breath.   Cardiovascular: Negative for chest pain and leg swelling.  Gastrointestinal: Negative for abdominal distention, blood in stool, constipation, diarrhea, nausea and vomiting.  Genitourinary: Positive for flank pain. Negative for decreased urine volume, difficulty urinating, discharge, dysuria, hematuria, penile pain, penile swelling, scrotal swelling, testicular pain and urgency.  Musculoskeletal: Negative for back pain.  Skin: Negative.      Physical Exam Updated Vital Signs BP 104/75 (BP Location: Right Arm)   Pulse 74   Temp (!) 96.8 F (36 C) (Axillary)   Resp 16   Ht 5\' 8"  (1.727 m)   Wt 92.5 kg   SpO2 96%   BMI 31.02 kg/m   Physical Exam  Constitutional: He appears well-developed and  well-nourished. No distress.  HENT:  Head: Atraumatic.  Eyes: Pupils are equal, round, and reactive to light.  Neck: Normal range of motion. Neck supple.  Cardiovascular: Normal rate, regular rhythm, normal heart sounds and intact distal pulses.  Pulmonary/Chest: Effort normal and breath sounds normal. No stridor. No respiratory distress. He has no wheezes. He has no rales. He exhibits no tenderness.  Abdominal: Soft. Bowel sounds are normal. He exhibits no distension and no mass. There is no tenderness. There is no rebound and no guarding. No hernia.  Genitourinary: Testes normal. Cremasteric reflex is present. Right testis shows no mass, no swelling and no tenderness. Left testis shows no mass, no swelling and no tenderness.  Genitourinary Comments: GU exam with chaperone in room  Musculoskeletal: Normal range of motion. He exhibits no edema, tenderness or deformity.  No CVA tenderness  Neurological: He is alert.  Skin: Skin is warm and dry. He is not diaphoretic.  Psychiatric: He has a normal mood and affect.  Nursing note and vitals reviewed.    ED Treatments / Results  Labs (all labs ordered are listed, but only abnormal results are displayed) Labs Reviewed  URINALYSIS, ROUTINE W REFLEX MICROSCOPIC - Abnormal; Notable for the following components:      Result Value   Glucose, UA >=500 (*)    Hgb urine dipstick SMALL (*)    Ketones, ur 5 (*)    All other components within normal limits  CBC WITH DIFFERENTIAL/PLATELET - Abnormal; Notable for the following components:   WBC 10.9 (*)    MCV 100.8 (*)    Monocytes Absolute 1.1 (*)    All other components within normal limits  COMPREHENSIVE METABOLIC PANEL - Abnormal; Notable for the following components:   CO2 20 (*)    Glucose, Bld 122 (*)    Calcium 8.8 (*)    Total Protein 5.8 (*)    All other components within normal limits  CBG MONITORING, ED - Abnormal; Notable for the following components:   Glucose-Capillary 118 (*)      All other components within normal limits    EKG None  Radiology No results found.  Procedures Procedures (including critical care time)  Medications Ordered in ED Medications  ketorolac (TORADOL) 15 MG/ML injection 15 mg (15 mg Intravenous Given 01/07/18 0912)     Initial Impression / Assessment and Plan / ED Course  I have reviewed the triage vital signs and the nursing notes as well as past medical history.  Pertinent labs & imaging results that were available during my care of the patient were reviewed by me and considered in my medical decision making (see chart for details).  57 year old otherwise healthy appearing male presents for evaluation of left-sided flank pain since 11 PM yesterday evening.  History of kidney stones.  Denies dysuria, hematuria.  His pain is currently a 0/10.  Nontender abdomen without rigidity or guarding. No CVA tenderness. Patient  is nontoxic, nonseptic appearing, in no apparent distress.  Patient does not meet the SIRS or Sepsis criteria. On repeat exam patient does not have a surgical abdomin and there are no peritoneal signs.  No indication of appendicitis, bowel obstruction, bowel perforation, cholecystitis, diverticulitis.  Will obtain urine, labs and reevaluate.  Mildly elevated glucose, patient is diabetic.  Urine with Hbg.  Patient's pain is most likely due to kidney stone.  Patient pain is a 3/10 on reevaluation.  Do not feel patient needs a CT scan at this time as he has a benign abdominal exam, is without difficulty urinating and has a history of kidney stones, WBC is 10.9, Creatinine WNL, VSS, the pt does not have irratractable vomiting .  Discussed with patient strict return precautions.  I have DC patient home with short course of pain medicine and Flomax.  I looked him up in the PMP aware database and he does not have any active narcotic prescriptions.  Discussed with patient not to drive while taking these medicines.  Patient and wife voiced  understanding agreeable for follow-up.   Final Clinical Impressions(s) / ED Diagnoses   Final diagnoses:  Flank pain    ED Discharge Orders         Ordered    HYDROcodone-acetaminophen (NORCO/VICODIN) 5-325 MG tablet  Every 4 hours PRN     01/07/18 0859    tamsulosin (FLOMAX) 0.4 MG CAPS capsule  Daily     01/07/18 0859           Yahir Tavano A, PA-C 01/07/18 1048    Pollina, Gwenyth Allegra, MD 01/08/18 [phone]

## 2018-01-07 NOTE — ED Notes (Signed)
IV removed from R hand; catheter intact; iv site clean, dry, intact

## 2018-01-07 NOTE — Discharge Instructions (Signed)
You were evaluated today for left-sided flank pain.  Your blood work was normal.  Your urine did show some red blood cells which can indicate a kidney stone.  I have given you a short course of pain medicine and a medication called Flomax.  Please take as prescribed.  Please follow-up with the urologist if you have continued symptoms.    Please return to the emergency room with any new or worsening symptoms such as: Contact a doctor if: You have pain that gets worse or does not get better with medicine. Get help right away if: You have a fever or chills. You get very bad pain. You get new pain in your belly (abdomen). You pass out (faint). You cannot pee.

## 2018-01-07 NOTE — ED Triage Notes (Signed)
Pt has been experiencing left sided flank pain since 11pm last night.  Hx of kidney stones.  Took some OTC, eased up but not starting back up.

## 2018-06-17 DIAGNOSIS — M25511 Pain in right shoulder: Secondary | ICD-10-CM | POA: Insufficient documentation

## 2018-12-24 ENCOUNTER — Other Ambulatory Visit: Payer: Self-pay

## 2018-12-24 ENCOUNTER — Encounter (HOSPITAL_BASED_OUTPATIENT_CLINIC_OR_DEPARTMENT_OTHER): Payer: Self-pay | Admitting: *Deleted

## 2018-12-28 ENCOUNTER — Other Ambulatory Visit (HOSPITAL_COMMUNITY)
Admission: RE | Admit: 2018-12-28 | Discharge: 2018-12-28 | Disposition: A | Discharge status: Home / Self Care | Payer: 59 | Source: Ambulatory Visit | Attending: Orthopaedic Surgery | Admitting: Orthopaedic Surgery

## 2018-12-28 ENCOUNTER — Other Ambulatory Visit: Payer: Self-pay

## 2018-12-28 ENCOUNTER — Encounter (HOSPITAL_BASED_OUTPATIENT_CLINIC_OR_DEPARTMENT_OTHER)
Admission: RE | Admit: 2018-12-28 | Discharge: 2018-12-28 | Disposition: A | Discharge status: Home / Self Care | Payer: 59 | Source: Ambulatory Visit | Attending: Orthopaedic Surgery | Admitting: Orthopaedic Surgery

## 2018-12-28 DIAGNOSIS — Z7984 Long term (current) use of oral hypoglycemic drugs: Secondary | ICD-10-CM | POA: Diagnosis not present

## 2018-12-28 DIAGNOSIS — E7800 Pure hypercholesterolemia, unspecified: Secondary | ICD-10-CM | POA: Diagnosis not present

## 2018-12-28 DIAGNOSIS — Z01812 Encounter for preprocedural laboratory examination: Secondary | ICD-10-CM | POA: Diagnosis not present

## 2018-12-28 DIAGNOSIS — Z96641 Presence of right artificial hip joint: Secondary | ICD-10-CM | POA: Diagnosis not present

## 2018-12-28 DIAGNOSIS — Z20828 Contact with and (suspected) exposure to other viral communicable diseases: Secondary | ICD-10-CM | POA: Insufficient documentation

## 2018-12-28 DIAGNOSIS — Z7982 Long term (current) use of aspirin: Secondary | ICD-10-CM | POA: Diagnosis not present

## 2018-12-28 DIAGNOSIS — M75111 Incomplete rotator cuff tear or rupture of right shoulder, not specified as traumatic: Secondary | ICD-10-CM | POA: Diagnosis not present

## 2018-12-28 DIAGNOSIS — E119 Type 2 diabetes mellitus without complications: Secondary | ICD-10-CM | POA: Diagnosis not present

## 2018-12-28 DIAGNOSIS — I10 Essential (primary) hypertension: Secondary | ICD-10-CM | POA: Diagnosis not present

## 2018-12-28 DIAGNOSIS — Z6829 Body mass index (BMI) 29.0-29.9, adult: Secondary | ICD-10-CM | POA: Diagnosis not present

## 2018-12-28 DIAGNOSIS — E669 Obesity, unspecified: Secondary | ICD-10-CM | POA: Diagnosis not present

## 2018-12-28 DIAGNOSIS — Z87891 Personal history of nicotine dependence: Secondary | ICD-10-CM | POA: Diagnosis not present

## 2018-12-28 DIAGNOSIS — Z79899 Other long term (current) drug therapy: Secondary | ICD-10-CM | POA: Diagnosis not present

## 2018-12-28 LAB — BASIC METABOLIC PANEL
Anion gap: 9 (ref 5–15)
BUN: 14 mg/dL (ref 6–20)
CO2: 26 mmol/L (ref 22–32)
Calcium: 9.1 mg/dL (ref 8.9–10.3)
Chloride: 105 mmol/L (ref 98–111)
Creatinine, Ser: 0.74 mg/dL (ref 0.61–1.24)
GFR calc Af Amer: >60 mL/min (ref >60)
GFR calc non Af Amer: >60 mL/min (ref >60)
Glucose, Bld: 138 mg/dL — ABNORMAL HIGH (ref 70–99)
Potassium: 4.7 mmol/L (ref 3.5–5.1)
Sodium: 140 mmol/L (ref 135–145)

## 2018-12-28 NOTE — Progress Notes (Signed)

## 2018-12-30 ENCOUNTER — Encounter (HOSPITAL_BASED_OUTPATIENT_CLINIC_OR_DEPARTMENT_OTHER): Payer: Self-pay | Admitting: Anesthesiology

## 2018-12-30 LAB — NOVEL CORONAVIRUS, NAA (HOSP ORDER, SEND-OUT TO REF LAB; TAT 18-24 HRS): SARS-CoV-2, NAA: NOT DETECTED

## 2018-12-30 NOTE — Anesthesia Preprocedure Evaluation (Addendum)
Anesthesia Evaluation  Patient identified by MRN, date of birth, ID band Patient awake    Reviewed: Allergy & Precautions, NPO status , Patient's Chart, lab work & pertinent test results  Airway Mallampati: II  TM Distance: >3 FB Neck ROM: Full    Dental no notable dental hx. (+) Teeth Intact   Pulmonary former smoker,    Pulmonary exam normal breath sounds clear to auscultation       Cardiovascular hypertension, Pt. on medications Normal cardiovascular exam Rhythm:Regular Rate:Normal     Neuro/Psych negative neurological ROS  negative psych ROS   GI/Hepatic negative GI ROS, Neg liver ROS,   Endo/Other  diabetes, Well Controlled, Type 2, Oral Hypoglycemic AgentsObesity Hyperlipidemia   Renal/GU negative Renal ROS  negative genitourinary   Musculoskeletal Torn rotator cuff right shoulder   Abdominal (+) + obese,   Peds  Hematology negative hematology ROS (+)   Anesthesia Other Findings   Reproductive/Obstetrics                            Anesthesia Physical Anesthesia Plan  ASA: II  Anesthesia Plan: General   Post-op Pain Management:  Regional for Post-op pain   Induction: Intravenous  PONV Risk Score and Plan: 3 and Midazolam, Ondansetron and Treatment may vary due to age or medical condition  Airway Management Planned: Oral ETT  Additional Equipment:   Intra-op Plan:   Post-operative Plan: Extubation in OR  Informed Consent: I have reviewed the patients History and Physical, chart, labs and discussed the procedure including the risks, benefits and alternatives for the proposed anesthesia with the patient or authorized representative who has indicated his/her understanding and acceptance.     Dental advisory given  Plan Discussed with: CRNA and Surgeon  Anesthesia Plan Comments:        Anesthesia Quick Evaluation

## 2018-12-31 ENCOUNTER — Other Ambulatory Visit: Payer: Self-pay

## 2018-12-31 ENCOUNTER — Ambulatory Visit (HOSPITAL_BASED_OUTPATIENT_CLINIC_OR_DEPARTMENT_OTHER)
Admission: RE | Admit: 2018-12-31 | Discharge: 2018-12-31 | Disposition: A | Discharge status: Home / Self Care | Payer: 59 | Attending: Orthopaedic Surgery | Admitting: Orthopaedic Surgery

## 2018-12-31 ENCOUNTER — Encounter (HOSPITAL_BASED_OUTPATIENT_CLINIC_OR_DEPARTMENT_OTHER)
Admission: RE | Disposition: A | Discharge status: Home / Self Care | Payer: Self-pay | Source: Home / Self Care | Attending: Orthopaedic Surgery

## 2018-12-31 ENCOUNTER — Ambulatory Visit (HOSPITAL_BASED_OUTPATIENT_CLINIC_OR_DEPARTMENT_OTHER): Payer: 59 | Admitting: Anesthesiology

## 2018-12-31 DIAGNOSIS — Z79899 Other long term (current) drug therapy: Secondary | ICD-10-CM | POA: Insufficient documentation

## 2018-12-31 DIAGNOSIS — Z6829 Body mass index (BMI) 29.0-29.9, adult: Secondary | ICD-10-CM | POA: Insufficient documentation

## 2018-12-31 DIAGNOSIS — E669 Obesity, unspecified: Secondary | ICD-10-CM | POA: Insufficient documentation

## 2018-12-31 DIAGNOSIS — Z87891 Personal history of nicotine dependence: Secondary | ICD-10-CM | POA: Insufficient documentation

## 2018-12-31 DIAGNOSIS — E119 Type 2 diabetes mellitus without complications: Secondary | ICD-10-CM | POA: Insufficient documentation

## 2018-12-31 DIAGNOSIS — Z7984 Long term (current) use of oral hypoglycemic drugs: Secondary | ICD-10-CM | POA: Insufficient documentation

## 2018-12-31 DIAGNOSIS — M75111 Incomplete rotator cuff tear or rupture of right shoulder, not specified as traumatic: Secondary | ICD-10-CM | POA: Insufficient documentation

## 2018-12-31 DIAGNOSIS — Z96641 Presence of right artificial hip joint: Secondary | ICD-10-CM | POA: Insufficient documentation

## 2018-12-31 DIAGNOSIS — Z7982 Long term (current) use of aspirin: Secondary | ICD-10-CM | POA: Insufficient documentation

## 2018-12-31 DIAGNOSIS — I10 Essential (primary) hypertension: Secondary | ICD-10-CM | POA: Insufficient documentation

## 2018-12-31 DIAGNOSIS — E7800 Pure hypercholesterolemia, unspecified: Secondary | ICD-10-CM | POA: Insufficient documentation

## 2018-12-31 HISTORY — PX: SHOULDER ARTHROSCOPY WITH ROTATOR CUFF REPAIR AND SUBACROMIAL DECOMPRESSION: SHX5686

## 2018-12-31 LAB — BASIC METABOLIC PANEL
Anion gap: 14 (ref 5–15)
BUN: 11 mg/dL (ref 6–20)
CO2: 23 mmol/L (ref 22–32)
Calcium: 9 mg/dL (ref 8.9–10.3)
Chloride: 102 mmol/L (ref 98–111)
Creatinine, Ser: 0.93 mg/dL (ref 0.61–1.24)
GFR calc Af Amer: >60 mL/min (ref >60)
GFR calc non Af Amer: >60 mL/min (ref >60)
Glucose, Bld: 198 mg/dL — ABNORMAL HIGH (ref 70–99)
Potassium: 4.4 mmol/L (ref 3.5–5.1)
Sodium: 139 mmol/L (ref 135–145)

## 2018-12-31 LAB — GLUCOSE, CAPILLARY
Glucose-Capillary: 178 mg/dL — ABNORMAL HIGH (ref 70–99)
Glucose-Capillary: 192 mg/dL — ABNORMAL HIGH (ref 70–99)

## 2018-12-31 SURGERY — SHOULDER ARTHROSCOPY WITH ROTATOR CUFF REPAIR AND SUBACROMIAL DECOMPRESSION
Anesthesia: General | Site: Shoulder | Laterality: Right

## 2018-12-31 MED ORDER — ONDANSETRON HCL 4 MG PO TABS: 4.0000 mg | ORAL_TABLET | Freq: Three times a day (TID) | ORAL | 1 refills | Status: AC | PRN

## 2018-12-31 MED ORDER — LIDOCAINE HCL (CARDIAC) PF 100 MG/5ML IV SOSY
PREFILLED_SYRINGE | INTRAVENOUS | Status: DC | PRN
  Administered 2018-12-31: 40 mg via INTRAVENOUS

## 2018-12-31 MED ORDER — HYDROMORPHONE HCL 1 MG/ML IJ SOLN
INTRAMUSCULAR | Status: AC
  Filled 2018-12-31: qty 0.5

## 2018-12-31 MED ORDER — FENTANYL CITRATE (PF) 100 MCG/2ML IJ SOLN
INTRAMUSCULAR | Status: AC
  Filled 2018-12-31: qty 2

## 2018-12-31 MED ORDER — MIDAZOLAM HCL 2 MG/2ML IJ SOLN
1.0000 mg | INTRAMUSCULAR | Status: DC | PRN
  Administered 2018-12-31: 2 mg via INTRAVENOUS

## 2018-12-31 MED ORDER — BUPIVACAINE-EPINEPHRINE (PF) 0.5% -1:200000 IJ SOLN
INTRAMUSCULAR | Status: AC
  Filled 2018-12-31: qty 60

## 2018-12-31 MED ORDER — DEXAMETHASONE SODIUM PHOSPHATE 10 MG/ML IJ SOLN
INTRAMUSCULAR | Status: AC
  Filled 2018-12-31: qty 1

## 2018-12-31 MED ORDER — OXYCODONE HCL 5 MG PO TABS: ORAL_TABLET | ORAL | 0 refills | Status: AC

## 2018-12-31 MED ORDER — OXYCODONE HCL 5 MG/5ML PO SOLN: 5.0000 mg | Freq: Once | ORAL | Status: AC | PRN

## 2018-12-31 MED ORDER — PROPOFOL 10 MG/ML IV BOLUS
INTRAVENOUS | Status: AC
  Filled 2018-12-31: qty 40

## 2018-12-31 MED ORDER — BUPIVACAINE HCL (PF) 0.5 % IJ SOLN
INTRAMUSCULAR | Status: DC | PRN
  Administered 2018-12-31: 15 mL via PERINEURAL

## 2018-12-31 MED ORDER — OXYCODONE HCL 5 MG PO TABS
5.0000 mg | ORAL_TABLET | Freq: Once | ORAL | Status: AC | PRN
  Administered 2018-12-31: 5 mg via ORAL

## 2018-12-31 MED ORDER — SODIUM CHLORIDE 0.9 % IR SOLN
Status: DC | PRN
  Administered 2018-12-31: 9000 mL

## 2018-12-31 MED ORDER — ACETAMINOPHEN 500 MG PO TABS: 1000.0000 mg | ORAL_TABLET | Freq: Three times a day (TID) | ORAL | 0 refills | Status: AC

## 2018-12-31 MED ORDER — BUPIVACAINE HCL (PF) 0.25 % IJ SOLN
INTRAMUSCULAR | Status: AC
  Filled 2018-12-31: qty 60

## 2018-12-31 MED ORDER — EPINEPHRINE PF 1 MG/ML IJ SOLN
INTRAMUSCULAR | Status: AC
  Filled 2018-12-31: qty 6

## 2018-12-31 MED ORDER — PHENYLEPHRINE HCL (PRESSORS) 10 MG/ML IV SOLN
INTRAVENOUS | Status: DC | PRN
  Administered 2018-12-31: 80 ug via INTRAVENOUS

## 2018-12-31 MED ORDER — ONDANSETRON HCL 4 MG/2ML IJ SOLN
INTRAMUSCULAR | Status: AC
  Filled 2018-12-31: qty 2

## 2018-12-31 MED ORDER — BUPIVACAINE LIPOSOME 1.3 % IJ SUSP
INTRAMUSCULAR | Status: DC | PRN
  Administered 2018-12-31: 10 mL via PERINEURAL

## 2018-12-31 MED ORDER — PROPOFOL 10 MG/ML IV BOLUS
INTRAVENOUS | Status: DC | PRN
  Administered 2018-12-31: 50 mg via INTRAVENOUS
  Administered 2018-12-31: 200 mg via INTRAVENOUS

## 2018-12-31 MED ORDER — FENTANYL CITRATE (PF) 100 MCG/2ML IJ SOLN
25.0000 ug | INTRAMUSCULAR | Status: DC | PRN
  Administered 2018-12-31 (×3): 50 ug via INTRAVENOUS

## 2018-12-31 MED ORDER — LACTATED RINGERS IV SOLN
INTRAVENOUS | Status: DC
  Administered 2018-12-31 (×2): via INTRAVENOUS

## 2018-12-31 MED ORDER — MELOXICAM 7.5 MG PO TABS: 7.5000 mg | ORAL_TABLET | Freq: Every day | ORAL | 2 refills | Status: AC

## 2018-12-31 MED ORDER — SUCCINYLCHOLINE CHLORIDE 20 MG/ML IJ SOLN
INTRAMUSCULAR | Status: DC | PRN
  Administered 2018-12-31: 120 mg via INTRAVENOUS

## 2018-12-31 MED ORDER — HYDROMORPHONE HCL 1 MG/ML IJ SOLN
0.5000 mg | INTRAMUSCULAR | Status: DC | PRN
  Administered 2018-12-31 (×2): 0.5 mg via INTRAVENOUS

## 2018-12-31 MED ORDER — ROCURONIUM BROMIDE 10 MG/ML (PF) SYRINGE
PREFILLED_SYRINGE | INTRAVENOUS | Status: AC
  Filled 2018-12-31: qty 10

## 2018-12-31 MED ORDER — CEFAZOLIN SODIUM-DEXTROSE 2-4 GM/100ML-% IV SOLN
INTRAVENOUS | Status: AC
  Filled 2018-12-31: qty 100

## 2018-12-31 MED ORDER — LIDOCAINE 2% (20 MG/ML) 5 ML SYRINGE
INTRAMUSCULAR | Status: AC
  Filled 2018-12-31: qty 5

## 2018-12-31 MED ORDER — ONDANSETRON HCL 4 MG/2ML IJ SOLN: 4.0000 mg | Freq: Once | INTRAMUSCULAR | Status: DC | PRN

## 2018-12-31 MED ORDER — OXYCODONE HCL 5 MG PO TABS
ORAL_TABLET | ORAL | Status: AC
  Filled 2018-12-31: qty 1

## 2018-12-31 MED ORDER — SUGAMMADEX SODIUM 500 MG/5ML IV SOLN
INTRAVENOUS | Status: DC | PRN
  Administered 2018-12-31: 400 mg via INTRAVENOUS

## 2018-12-31 MED ORDER — CHLORHEXIDINE GLUCONATE 4 % EX LIQD: 60.0000 mL | Freq: Once | CUTANEOUS | Status: DC

## 2018-12-31 MED ORDER — DEXAMETHASONE SODIUM PHOSPHATE 4 MG/ML IJ SOLN
INTRAMUSCULAR | Status: DC | PRN
  Administered 2018-12-31: 5 mg via INTRAVENOUS

## 2018-12-31 MED ORDER — CEFAZOLIN SODIUM-DEXTROSE 2-4 GM/100ML-% IV SOLN
2.0000 g | INTRAVENOUS | Status: AC
  Administered 2018-12-31: 2 g via INTRAVENOUS

## 2018-12-31 MED ORDER — FENTANYL CITRATE (PF) 100 MCG/2ML IJ SOLN
50.0000 ug | INTRAMUSCULAR | Status: AC | PRN
  Administered 2018-12-31 (×2): 25 ug via INTRAVENOUS
  Administered 2018-12-31 (×2): 100 ug via INTRAVENOUS

## 2018-12-31 MED ORDER — EPHEDRINE SULFATE 50 MG/ML IJ SOLN
INTRAMUSCULAR | Status: DC | PRN
  Administered 2018-12-31: 15 mg via INTRAVENOUS

## 2018-12-31 MED ORDER — MIDAZOLAM HCL 2 MG/2ML IJ SOLN
INTRAMUSCULAR | Status: AC
  Filled 2018-12-31: qty 2

## 2018-12-31 MED ORDER — MEPERIDINE HCL 25 MG/ML IJ SOLN: 6.2500 mg | INTRAMUSCULAR | Status: DC | PRN

## 2018-12-31 MED ORDER — ROCURONIUM 10MG/ML (10ML) SYRINGE FOR MEDFUSION PUMP - OPTIME
INTRAVENOUS | Status: DC | PRN
  Administered 2018-12-31: 40 mg via INTRAVENOUS

## 2018-12-31 MED ORDER — SODIUM CHLORIDE 0.9 % IR SOLN
Status: DC | PRN
  Administered 2018-12-31: 2 mL

## 2018-12-31 SURGICAL SUPPLY — 69 items
ANCHOR SUT 1.8 FBRTK KNTLS 2SU (Anchor) ×6 IMPLANT
ANCHOR SUT BIO SW 4.75X19.1 (Anchor) ×3 IMPLANT
BLADE EXCALIBUR 4.0MM X 13CM (MISCELLANEOUS) ×1
BLADE EXCALIBUR 4.0X13 (MISCELLANEOUS) ×2 IMPLANT
BURR OVAL 8 FLU 4.0MM X 13CM (MISCELLANEOUS)
BURR OVAL 8 FLU 4.0X13 (MISCELLANEOUS) IMPLANT
CANNULA PASSPORT 5 (CANNULA) ×2 IMPLANT
CANNULA PASSPORT 5CM (CANNULA) ×1
CANNULA PASSPORT BUTTON 10-40 (CANNULA) IMPLANT
CANNULA TWIST IN 8.25X7CM (CANNULA) IMPLANT
CHLORAPREP W/TINT 26 (MISCELLANEOUS) ×3 IMPLANT
CLOSURE WOUND 1/2 X4 (GAUZE/BANDAGES/DRESSINGS)
COVER WAND RF STERILE (DRAPES) IMPLANT
DECANTER SPIKE VIAL GLASS SM (MISCELLANEOUS) IMPLANT
DISSECTOR 3.5MM X 13CM CVD (MISCELLANEOUS) IMPLANT
DISSECTOR 4.0MMX13CM CVD (MISCELLANEOUS) IMPLANT
DRAPE HALF SHEET 70X43 (DRAPES) ×3 IMPLANT
DRAPE IMP U-DRAPE 54X76 (DRAPES) ×3 IMPLANT
DRAPE INCISE IOBAN 66X45 STRL (DRAPES) IMPLANT
DRAPE SHOULDER BEACH CHAIR (DRAPES) ×3 IMPLANT
DRSG PAD ABDOMINAL 8X10 ST (GAUZE/BANDAGES/DRESSINGS) ×3 IMPLANT
DW OUTFLOW CASSETTE/TUBE SET (MISCELLANEOUS) ×3 IMPLANT
GAUZE SPONGE 4X4 12PLY STRL (GAUZE/BANDAGES/DRESSINGS) ×3 IMPLANT
GAUZE XEROFORM 1X8 LF (GAUZE/BANDAGES/DRESSINGS) IMPLANT
GLOVE BIO SURGEON STRL SZ 6.5 (GLOVE) ×6 IMPLANT
GLOVE BIO SURGEONS STRL SZ 6.5 (GLOVE) ×3
GLOVE BIOGEL PI IND STRL 6.5 (GLOVE) ×1 IMPLANT
GLOVE BIOGEL PI IND STRL 7.0 (GLOVE) ×2 IMPLANT
GLOVE BIOGEL PI IND STRL 8 (GLOVE) ×2 IMPLANT
GLOVE BIOGEL PI INDICATOR 6.5 (GLOVE) ×2
GLOVE BIOGEL PI INDICATOR 7.0 (GLOVE) ×4
GLOVE BIOGEL PI INDICATOR 8 (GLOVE) ×4
GLOVE ECLIPSE 8.0 STRL XLNG CF (GLOVE) ×3 IMPLANT
GOWN STRL REUS W/ TWL LRG LVL3 (GOWN DISPOSABLE) ×1 IMPLANT
GOWN STRL REUS W/TWL LRG LVL3 (GOWN DISPOSABLE) ×2
GOWN STRL REUS W/TWL XL LVL3 (GOWN DISPOSABLE) ×3 IMPLANT
IMPL SPEEDBRIDGE KIT (Orthopedic Implant) ×1 IMPLANT
IMPLANT SPEEDBRIDGE KIT (Orthopedic Implant) ×3 IMPLANT
KIT STABILIZATION SHOULDER (MISCELLANEOUS) ×3 IMPLANT
KIT STR SPEAR 1.8 FBRTK DISP (KITS) ×3 IMPLANT
LASSO 90 CVE QUICKPAS (DISPOSABLE) IMPLANT
LASSO CRESCENT QUICKPASS (SUTURE) ×3 IMPLANT
MANIFOLD NEPTUNE II (INSTRUMENTS) ×3 IMPLANT
NDL SAFETY ECLIPSE 18X1.5 (NEEDLE) ×1 IMPLANT
NDL SUT 6 .5 CRC .975X.05 MAYO (NEEDLE) IMPLANT
NEEDLE HYPO 18GX1.5 SHARP (NEEDLE) ×2
NEEDLE MAYO TAPER (NEEDLE)
NEEDLE SCORPION MULTI FIRE (NEEDLE) IMPLANT
PACK ARTHROSCOPY DSU (CUSTOM PROCEDURE TRAY) ×3 IMPLANT
PACK BASIN DAY SURGERY FS (CUSTOM PROCEDURE TRAY) ×3 IMPLANT
PAD ORTHO SHOULDER 7X19 LRG (SOFTGOODS) ×3 IMPLANT
PORT APPOLLO RF 90DEGREE MULTI (SURGICAL WAND) ×3 IMPLANT
RESTRAINT HEAD UNIVERSAL NS (MISCELLANEOUS) ×3 IMPLANT
SLEEVE SCD COMPRESS KNEE MED (MISCELLANEOUS) ×3 IMPLANT
SLING ARM FOAM STRAP LRG (SOFTGOODS) IMPLANT
SPONGE LAP 4X18 RFD (DISPOSABLE) IMPLANT
STRIP CLOSURE SKIN 1/2X4 (GAUZE/BANDAGES/DRESSINGS) IMPLANT
SUT FIBERWIRE #2 38 T-5 BLUE (SUTURE)
SUT MNCRL AB 4-0 PS2 18 (SUTURE) ×3 IMPLANT
SUT PDS AB 1 CT  36 (SUTURE)
SUT PDS AB 1 CT 36 (SUTURE) IMPLANT
SUT TIGER TAPE 7 IN WHITE (SUTURE) IMPLANT
SUTURE FIBERWR #2 38 T-5 BLUE (SUTURE) IMPLANT
SUTURE TAPE TIGERLINK 1.3MM BL (SUTURE) IMPLANT
SUTURETAPE TIGERLINK 1.3MM BL (SUTURE)
SYR 5ML LL (SYRINGE) ×3 IMPLANT
TAPE FIBER 2MM 7IN #2 BLUE (SUTURE) ×3 IMPLANT
TOWEL GREEN STERILE FF (TOWEL DISPOSABLE) ×3 IMPLANT
TUBING ARTHROSCOPY IRRIG 16FT (MISCELLANEOUS) ×3 IMPLANT

## 2018-12-31 NOTE — H&P (Signed)
PREOPERATIVE H&P  Chief Complaint: RIGHT SHOULDER ROTATOR CUFF TEAR  HPI: Matthew Mcdowell is a 58 y.o. male who presents for preoperative history and physical with a diagnosis of RIGHT SHOULDER Terminous. Symptoms are rated as moderate to severe, and have been worsening.  This is significantly impairing activities of daily living.  Please see my clinic note for full details on this patient's care.  He has elected for surgical management.   Past Medical History:  Diagnosis Date  . Diabetes ([place])   . Hypercholesterolemia   . Hypertension    Past Surgical History:  Procedure Laterality Date  . COLONOSCOPY N/A 03/08/2013   Procedure: COLONOSCOPY;  Surgeon: Danie Binder, MD;  Location[facility];  Service: Endoscopy;  Laterality: N/A;  9:30  . TOTAL HIP ARTHROPLASTY     right   Social History   Socioeconomic History  . Marital status: Married    Spouse name: Not on file  . Number of children: Not on file  . Years of education: Not on file  . Highest education level: Not on file  Occupational History  . Not on file  Social Needs  . Financial resource strain: Not on file  . Food insecurity    Worry: Not on file    Inability: Not on file  . Transportation needs    Medical: Not on file    Non-medical: Not on file  Tobacco Use  . Smoking status: Former Research scientist (life sciences)  . Smokeless tobacco: Never Used  Substance and Sexual Activity  . Alcohol use: Yes    Alcohol/week: 0.0 standard drinks    Comment: very little  . Drug use: No  . Sexual activity: Never  Lifestyle  . Physical activity    Days per week: Not on file    Minutes per session: Not on file  . Stress: Not on file  Relationships  . Social Herbalist on phone: Not on file    Gets together: Not on file    Attends religious service: Not on file    Active member of club or organization: Not on file    Attends meetings of clubs or organizations: Not on file    Relationship status: Not on file  Other  Topics Concern  . Not on file  Social History Narrative  . Not on file   Family History  Problem Relation Age of Onset  . Cancer Mother 42       VULVAR  . Breast cancer Mother    No Known Allergies Prior to Admission medications   Medication Sig Start Date End Date Taking? Authorizing Provider  aspirin EC 81 MG tablet Take 81 mg by mouth daily.   Yes [provider]  atorvastatin (LIPITOR) 20 MG tablet Take 20 mg by mouth daily.   Yes [provider]  canagliflozin (INVOKANA) 300 MG TABS tablet Take 300 mg by mouth daily.    Yes [provider]  lisinopril (PRINIVIL,ZESTRIL) 10 MG tablet Take 10 mg by mouth at bedtime.  12/24/12  Yes [provider]  metFORMIN (GLUCOPHAGE) 1000 MG tablet Take 1,000 mg by mouth 2 (two) times daily with a meal.  11/08/12  Yes [provider]  Probiotic Product (PROBIOTIC PO) Take 1 capsule by mouth daily.   Yes [provider]  tamsulosin (FLOMAX) 0.4 MG CAPS capsule Take 1 capsule (0.4 mg total) by mouth daily. 01/07/18   Henderly, Britni A, PA-C     Positive ROS: All  other systems have been reviewed and were otherwise negative with the exception of those mentioned in the HPI and as above.  Physical Exam: General: Alert, no acute distress Cardiovascular: No pedal edema Respiratory: No cyanosis, no use of accessory musculature GI: No organomegaly, abdomen is soft and non-tender Skin: No lesions in the area of chief complaint Neurologic: Sensation intact distally Psychiatric: Patient is competent for consent with normal mood and affect Lymphatic: No axillary or cervical lymphadenopathy  MUSCULOSKELETAL: R shoulder ROM intact, pain and 4/5 cuff strength.  Assessment: RIGHT SHOULDER ROTATOR CUFF TEAR  Plan: Plan for Procedure(s): RIGHT SHOULDER ARTHROSCOPY WITH ROTATOR CUFF REPAIR AND SUBACROMIAL DECOMPRESSION, DISTAL CLAVICULECTOMY, EXTENSIVE DEBRIDEMENT, POSSIBLE BICEP TENODESIS  The risks  benefits and alternatives were discussed with the patient including but not limited to the risks of nonoperative treatment, versus surgical intervention including infection, bleeding, nerve injury,  blood clots, cardiopulmonary complications, morbidity, mortality, among others, and they were willing to proceed.   Hiram Gash, MD  12/31/2018 7:11 AM

## 2018-12-31 NOTE — Op Note (Signed)
Orthopaedic Surgery Operative Note (CSN: [id number])  Matthew Mcdowell  Sep 03, 1960 Date of Surgery: 12/31/2018   Diagnoses:  Right shoulder massive rotator cuff repair  Procedure: Right shoulder arthroscopic subscapularis and supraspinatus repair Right shoulder extensive debridement Right shoulder subacromial decompression Right distal clavicle excision Right biceps tenotomy   Operative Finding Exam under anesthesia: Full motion no limitation Articular space: No loose bodies, capsule intact, significant anterior and inferior labral fraying Chondral surfaces:Intact, no sign of chondral degeneration on the glenoid or humeral head Biceps: Significant degeneration of the tendon Subscapularis: Near complete tear with the upper three quarters of the tendon involved.  Tendon quality was poor.  Single row repair was performed. Superior Cuff: Full-thickness poor quality retracted tear of the supraspinatus in isolation.  We were able to reduce this back to the tuberosity with medialization of the attachment. Bursal side: Full-thickness supraspinatus tear as above.  Biceps was not amenable to fixation.  Successful completion of the planned procedure.  Attempted biceps tendon tenodesis but the tissue quality and bone quality precluded anchor fixation.  We opted out of a superior capsular reconstruction as the tissue quality was reasonable though not as robust as we would expect in a younger healthier patient.  We still felt that repair of his native tendon was better than a superior capsular reconstruction and a subscapularis tear made that option less likely to have a good result.   Post-operative plan: The patient will be sling for 6 weeks with therapy to start after 6 weeks.  The patient will be discharged home.  DVT prophylaxis not indicated in ambulatory upper extremity patient without known risk factors.   Pain control with PRN pain medication preferring oral medicines.  Follow up plan will be  scheduled in approximately 7 days for incision check and XR.  Post-Op Diagnosis: Same Surgeons:Primary: Hiram Gash, MD Assistants: Location: [facility] OR ROOM 6 Anesthesia: General with Exparel interscalene block Antibiotics: Ancef 2g preop Tourniquet time: * No tourniquets in log * Estimated Blood Loss: Minimal Complications: None Specimens: None Implants: Implant Name Type Inv. Item Serial No. Manufacturer Lot No. LRB No. Used Action  ANCHOR SUT BIO SW 4.75X19.1 - [id number] Anchor ANCHOR SUT BIO SW 4.75X19.1  [employer] [id number] Right 1 Implanted  IMPLANT SPEEDBRIDGE KIT - [id number] Orthopedic Implant IMPLANT SPEEDBRIDGE KIT  [employer] [id number] Right 1 Implanted    Indications for Surgery:   Matthew Mcdowell is a 58 y.o. male with continued shoulder pain refractory to nonoperative measures for extended period of time.    The risks and benefits were explained at length including but not limited to continued pain, cuff failure, biceps tenodesis failure, stiffness, need for further surgery and infection.   Procedure:   Patient was correctly identified in the preoperative holding area and operative site marked.  Patient brought to OR and positioned beachchair on an [place] table ensuring that all bony prominences were padded and the head was in an appropriate location.  Anesthesia was induced and the operative shoulder was prepped and draped in the usual sterile fashion.  Timeout was called preincision.  A standard posterior viewing portal was made after localizing the portal with a spinal needle.  An anterior accessory portal was also made.  After clearing the articular space the camera was positioned in the subacromial space.  Findings above.  Subscapularis Repair: We identified a subscapularis tear that involved the upper three quarters of the tendon.  Using a grasping device were able to demonstrate that the tendon could be reapproximated  to the lesser tuberosity.  We felt that it was  repairable.  We then used a RF ablator to open the rotator interval and released the MGHL to allow further translation of the subscapularis.  This point we cleared both anterior and posterior to the tendon taking care to avoid inferior migration to avoid the neurovascular structures as well as the muscular cutaneous nerve.  We then used a lasso to pass a fiber tape in a mattress fashion through the subscapularis and based 4.75 swivel lock was used to repair back to the lesser tuberosity after prepping the tuberosity extensively.  We had good approximation of the tendon and a recreation of the rolled border.  Extensive debridement was performed to the articular surface, the rotator interval and biceps tenotomy was performed.  Subacromial decompression: We made a lateral portal with spinal needle guidance. We then proceeded to debride bursal tissue extensively with a shaver and arthrocare device. At that point we continued to identify the borders of the acromion and identify the spur. We then carefully preserved the deltoid fascia and used a burr to convert the type 2 acromion to a Type 1 flat acromion without issue.  Distal Clavicle resection:  The scope was placed in the subacromial space from the posterior portal.  A hemostat was placed through the anterior portal and we spread at the [facility] joint.  A burr was then inserted and 10 mm of distal clavicle was resected taking care to avoid damage to the capsule around the joint and avoiding overhanging bone posteriorly.    Arthroscopic Rotator Cuff Repair: Tuberosity was prepared with a burr to a bleeding bed.  Following completion of the above we placed 2 4.7 Swivelock anchor loaded with a tape at inserted at the medial articular margin and an scorpion suture passing device, shuttled  sutures medially in a horizontal mattress suture configuration.  We then tied using arthroscopic knot tying techniques  each suture to its partner reducing the tendon at the prepared  insertion site.  The fiber tape was not tied. With a medial row suture limbs then incorporated, 2 anteriorly and  2 posteriorly, into each of two 4.75 PEEK SwiveLock anchors, each placed 8 to 10 mm below the tip of the tuberosity and spanning anterior-posterior width of the tear with care to avoid over tensioning.   The incisions were closed with absorbable monocryl and steri strips.  A sterile dressing was placed along with a sling. The patient was awoken from general anesthesia and taken to the PACU in stable condition without complication.

## 2018-12-31 NOTE — Progress Notes (Signed)
AssistedDr. Carignan with right, ultrasound guided, interscalene  block. Side rails up, monitors on throughout procedure. See vital signs in flow sheet. Tolerated Procedure well.  

## 2018-12-31 NOTE — Transfer of Care (Signed)
Immediate Anesthesia Transfer of Care Note  Patient: Matthew Mcdowell  Procedure(s) Performed: RIGHT SHOULDER ARTHROSCOPY WITH ROTATOR CUFF REPAIR AND SUBACROMIAL DECOMPRESSION, DISTAL CLAVICULECTOMY, EXTENSIVE DEBRIDEMENT (Right Shoulder)  Patient Location: PACU  Anesthesia Type:General and Regional  Level of Consciousness: awake, alert  and oriented  Airway & Oxygen Therapy: Patient Spontanous Breathing and Patient connected to face mask oxygen  Post-op Assessment: Report given to RN and Post -op Vital signs reviewed and stable  Post vital signs: Reviewed and stable  Last Vitals:  Vitals Value Taken Time  BP    Temp    Pulse 91 12/31/18 0936  Resp    SpO2 97 % 12/31/18 0936  Vitals shown include unvalidated device data.  Last Pain:  Vitals:   12/31/18 0654  TempSrc:   PainSc: 5       Patients Stated Pain Goal: 5 ([id number])  Complications: No apparent anesthesia complications

## 2018-12-31 NOTE — Discharge Instructions (Signed)
Post Anesthesia Home Care Instructions  Activity: Get plenty of rest for the remainder of the day. A responsible individual must stay with you for 24 hours following the procedure.  For the next 24 hours, DO NOT: -Drive a car -Operate machinery -Drink alcoholic beverages -Take any medication unless instructed by your physician -Make any legal decisions or sign important papers.  Meals: Start with liquid foods such as gelatin or soup. Progress to regular foods as tolerated. Avoid greasy, spicy, heavy foods. If nausea and/or vomiting occur, drink only clear liquids until the nausea and/or vomiting subsides. Call your physician if vomiting continues.  Special Instructions/Symptoms: Your throat may feel dry or sore from the anesthesia or the breathing tube placed in your throat during surgery. If this causes discomfort, gargle with warm salt water. The discomfort should disappear within 24 hours.  If you had a scopolamine patch placed behind your ear for the management of post- operative nausea and/or vomiting:  1. The medication in the patch is effective for 72 hours, after which it should be removed.  Wrap patch in a tissue and discard in the trash. Wash hands thoroughly with soap and water. 2. You may remove the patch earlier than 72 hours if you experience unpleasant side effects which may include dry mouth, dizziness or visual disturbances. 3. Avoid touching the patch. Wash your hands with soap and water after contact with the patch.      Regional Anesthesia Blocks  1. Numbness or the inability to move the "blocked" extremity may last from 3-48 hours after placement. The length of time depends on the medication injected and your individual response to the medication. If the numbness is not going away after 48 hours, call your surgeon.  2. The extremity that is blocked will need to be protected until the numbness is gone and the  Strength has returned. Because you cannot feel it, you  will need to take extra care to avoid injury. Because it may be weak, you may have difficulty moving it or using it. You may not know what position it is in without looking at it while the block is in effect.  3. For blocks in the legs and feet, returning to weight bearing and walking needs to be done carefully. You will need to wait until the numbness is entirely gone and the strength has returned. You should be able to move your leg and foot normally before you try and bear weight or walk. You will need someone to be with you when you first try to ensure you do not fall and possibly risk injury.  4. Bruising and tenderness at the needle site are common side effects and will resolve in a few days.  5. Persistent numbness or new problems with movement should be communicated to the surgeon or the [facility] ([phone])/ [facility] ([phone]).  Information for Discharge Teaching: EXPAREL (bupivacaine liposome injectable suspension)   Your surgeon or anesthesiologist gave you EXPAREL(bupivacaine) to help control your pain after surgery.   EXPAREL is a local anesthetic that provides pain relief by numbing the tissue around the surgical site.  EXPAREL is designed to release pain medication over time and can control pain for up to 72 hours.  Depending on how you respond to EXPAREL, you may require less pain medication during your recovery.  Possible side effects:  Temporary loss of sensation or ability to move in the area where bupivacaine was injected.  Nausea, vomiting, constipation  Rarely,   numbness and tingling in your mouth or lips, lightheadedness, or anxiety may occur.  Call your doctor right away if you think you may be experiencing any of these sensations, or if you have other questions regarding possible side effects.  Follow all other discharge instructions given to you by your surgeon or nurse. Eat a healthy diet and drink plenty of water or other  fluids.  If you return to the hospital for any reason within 96 hours following the administration of EXPAREL, it is important for health care providers to know that you have received this anesthetic. A teal colored band has been placed on your arm with the date, time and amount of EXPAREL you have received in order to alert and inform your health care providers. Please leave this armband in place for the full 96 hours following administration, and then you may remove the band. 

## 2018-12-31 NOTE — Anesthesia Procedure Notes (Signed)
Anesthesia Regional Block: Supraclavicular block   Pre-Anesthetic Checklist: ,, timeout performed, Correct Patient, Correct Site, Correct Laterality, Correct Procedure, Correct Position, site marked, Risks and benefits discussed,  Surgical consent,  Pre-op evaluation,  At surgeon's request and post-op pain management  Laterality: Right and Upper  Prep: Maximum Sterile Barrier Precautions used, chloraprep       Needles:  Injection technique: Single-shot  Needle Type: Echogenic Stimulator Needle     Needle Length: 10cm      Additional Needles:   Procedures:,,,, ultrasound used (permanent image in chart),,,,  Narrative:  Start time: 12/31/2018 7:00 AM End time: 12/31/2018 7:16 AM Injection made incrementally with aspirations every 5 mL.  Performed by: Personally  Anesthesiologist: Montez Hageman, MD  Additional Notes: Risks, benefits and alternative to block explained extensively.  Patient tolerated procedure well, without complications.

## 2018-12-31 NOTE — Anesthesia Postprocedure Evaluation (Signed)
Anesthesia Post Note  Patient: Matthew Mcdowell  Procedure(s) Performed: RIGHT SHOULDER ARTHROSCOPY WITH ROTATOR CUFF REPAIR AND SUBACROMIAL DECOMPRESSION, DISTAL CLAVICULECTOMY, EXTENSIVE DEBRIDEMENT (Right Shoulder)     Patient location during evaluation: PACU Anesthesia Type: General Level of consciousness: awake and alert Pain management: pain level controlled Vital Signs Assessment: post-procedure vital signs reviewed and stable Respiratory status: spontaneous breathing, nonlabored ventilation and respiratory function stable Cardiovascular status: blood pressure returned to baseline and stable Postop Assessment: no apparent nausea or vomiting Anesthetic complications: no    Last Vitals:  Vitals:   12/31/18 1100 12/31/18 1115  BP: 133/84 (!) 140/93  Pulse: 89 87  Resp: 17 17  Temp:    SpO2: 92% 90%    Last Pain:  Vitals:   12/31/18 1100  TempSrc:   PainSc: 9                  Marquis Down A.

## 2018-12-31 NOTE — Anesthesia Procedure Notes (Signed)
Procedure Name: Intubation Performed by: Verita Lamb, CRNA Pre-anesthesia Checklist: Patient identified, Emergency Drugs available, Suction available, Timeout performed and Patient being monitored Patient Re-evaluated:Patient Re-evaluated prior to induction Oxygen Delivery Method: Circle system utilized Preoxygenation: Pre-oxygenation with 100% oxygen Induction Type: IV induction Ventilation: Mask ventilation without difficulty Laryngoscope Size: Mac and 4 Grade View: Grade I Tube type: Oral Number of attempts: 1 Airway Equipment and Method: Stylet and Oral airway Placement Confirmation: ETT inserted through vocal cords under direct vision,  positive ETCO2,  breath sounds checked- equal and bilateral and CO2 detector Secured at: 22 cm Tube secured with: Tape Dental Injury: Teeth and Oropharynx as per pre-operative assessment

## 2019-01-01 ENCOUNTER — Encounter (HOSPITAL_BASED_OUTPATIENT_CLINIC_OR_DEPARTMENT_OTHER): Payer: Self-pay | Admitting: Orthopaedic Surgery

## 2019-08-02 ENCOUNTER — Other Ambulatory Visit: Payer: Self-pay

## 2019-08-02 ENCOUNTER — Other Ambulatory Visit (HOSPITAL_COMMUNITY): Payer: Self-pay | Admitting: Internal Medicine

## 2019-08-02 ENCOUNTER — Ambulatory Visit (HOSPITAL_COMMUNITY)
Admission: RE | Admit: 2019-08-02 | Discharge: 2019-08-02 | Disposition: A | Discharge status: Home / Self Care | Payer: 59 | Source: Ambulatory Visit | Attending: Internal Medicine | Admitting: Internal Medicine

## 2019-08-02 DIAGNOSIS — M5416 Radiculopathy, lumbar region: Secondary | ICD-10-CM

## 2020-04-03 DIAGNOSIS — H6123 Impacted cerumen, bilateral: Secondary | ICD-10-CM | POA: Insufficient documentation

## 2020-07-05 ENCOUNTER — Emergency Department (HOSPITAL_COMMUNITY)
Admission: EM | Admit: 2020-07-05 | Discharge: 2020-07-05 | Disposition: A | Discharge status: Home / Self Care | Payer: BC Managed Care – PPO | Attending: Emergency Medicine | Admitting: Emergency Medicine

## 2020-07-05 ENCOUNTER — Other Ambulatory Visit: Payer: Self-pay

## 2020-07-05 ENCOUNTER — Encounter (HOSPITAL_COMMUNITY): Payer: Self-pay

## 2020-07-05 ENCOUNTER — Emergency Department (HOSPITAL_COMMUNITY): Payer: BC Managed Care – PPO

## 2020-07-05 DIAGNOSIS — I10 Essential (primary) hypertension: Secondary | ICD-10-CM | POA: Insufficient documentation

## 2020-07-05 DIAGNOSIS — Z96641 Presence of right artificial hip joint: Secondary | ICD-10-CM | POA: Insufficient documentation

## 2020-07-05 DIAGNOSIS — Z7984 Long term (current) use of oral hypoglycemic drugs: Secondary | ICD-10-CM | POA: Insufficient documentation

## 2020-07-05 DIAGNOSIS — Z7982 Long term (current) use of aspirin: Secondary | ICD-10-CM | POA: Insufficient documentation

## 2020-07-05 DIAGNOSIS — N200 Calculus of kidney: Secondary | ICD-10-CM | POA: Diagnosis not present

## 2020-07-05 DIAGNOSIS — Z87891 Personal history of nicotine dependence: Secondary | ICD-10-CM | POA: Diagnosis not present

## 2020-07-05 DIAGNOSIS — R109 Unspecified abdominal pain: Secondary | ICD-10-CM | POA: Diagnosis present

## 2020-07-05 DIAGNOSIS — Z79899 Other long term (current) drug therapy: Secondary | ICD-10-CM | POA: Insufficient documentation

## 2020-07-05 DIAGNOSIS — N201 Calculus of ureter: Secondary | ICD-10-CM | POA: Diagnosis not present

## 2020-07-05 DIAGNOSIS — E119 Type 2 diabetes mellitus without complications: Secondary | ICD-10-CM | POA: Insufficient documentation

## 2020-07-05 LAB — URINALYSIS, ROUTINE W REFLEX MICROSCOPIC
Bacteria, UA: NONE SEEN
Bilirubin Urine: NEGATIVE
Glucose, UA: >=500 mg/dL — AB
Hgb urine dipstick: NEGATIVE
Ketones, ur: NEGATIVE mg/dL
Leukocytes,Ua: NEGATIVE
Nitrite: NEGATIVE
Protein, ur: NEGATIVE mg/dL
Specific Gravity, Urine: 1.02 (ref 1.005–1.030)
pH: 6 (ref 5.0–8.0)

## 2020-07-05 LAB — CBC WITH DIFFERENTIAL/PLATELET
Abs Immature Granulocytes: 0.05 10*3/uL (ref 0.00–0.07)
Basophils Absolute: 0.1 10*3/uL (ref 0.0–0.1)
Basophils Relative: 1 %
Eosinophils Absolute: 0.2 10*3/uL (ref 0.0–0.5)
Eosinophils Relative: 2 %
HCT: 48.8 % (ref 39.0–52.0)
Hemoglobin: 15.9 g/dL (ref 13.0–17.0)
Immature Granulocytes: 1 %
Lymphocytes Relative: 27 %
Lymphs Abs: 2.9 10*3/uL (ref 0.7–4.0)
MCH: 32.6 pg (ref 26.0–34.0)
MCHC: 32.6 g/dL (ref 30.0–36.0)
MCV: 100.2 fL — ABNORMAL HIGH (ref 80.0–100.0)
Monocytes Absolute: 1 10*3/uL (ref 0.1–1.0)
Monocytes Relative: 10 %
Neutro Abs: 6.4 10*3/uL (ref 1.7–7.7)
Neutrophils Relative %: 59 %
Platelets: 173 10*3/uL (ref 150–400)
RBC: 4.87 MIL/uL (ref 4.22–5.81)
RDW: 13.2 % (ref 11.5–15.5)
WBC: 10.5 10*3/uL (ref 4.0–10.5)
nRBC: 0 % (ref 0.0–0.2)

## 2020-07-05 LAB — BASIC METABOLIC PANEL
Anion gap: 9 (ref 5–15)
BUN: 23 mg/dL — ABNORMAL HIGH (ref 6–20)
CO2: 22 mmol/L (ref 22–32)
Calcium: 9.2 mg/dL (ref 8.9–10.3)
Chloride: 108 mmol/L (ref 98–111)
Creatinine, Ser: 1.03 mg/dL (ref 0.61–1.24)
GFR, Estimated: >60 mL/min (ref >60)
Glucose, Bld: 171 mg/dL — ABNORMAL HIGH (ref 70–99)
Potassium: 4.2 mmol/L (ref 3.5–5.1)
Sodium: 139 mmol/L (ref 135–145)

## 2020-07-05 MED ORDER — FENTANYL CITRATE (PF) 100 MCG/2ML IJ SOLN
100.0000 ug | Freq: Once | INTRAMUSCULAR | Status: AC
  Administered 2020-07-05: 100 ug via INTRAVENOUS
  Filled 2020-07-05: qty 2

## 2020-07-05 MED ORDER — ONDANSETRON HCL 4 MG/2ML IJ SOLN
4.0000 mg | Freq: Once | INTRAMUSCULAR | Status: AC
  Administered 2020-07-05: 4 mg via INTRAVENOUS
  Filled 2020-07-05: qty 2

## 2020-07-05 MED ORDER — KETOROLAC TROMETHAMINE 30 MG/ML IJ SOLN
30.0000 mg | Freq: Once | INTRAMUSCULAR | Status: AC
  Administered 2020-07-05: 30 mg via INTRAVENOUS
  Filled 2020-07-05: qty 1

## 2020-07-05 MED ORDER — OXYCODONE-ACETAMINOPHEN 5-325 MG PO TABS: 1.0000 | ORAL_TABLET | Freq: Three times a day (TID) | ORAL | 0 refills | Status: DC | PRN

## 2020-07-05 NOTE — ED Provider Notes (Signed)
[facility] EMERGENCY DEPARTMENT Provider Note   CSN: [id number] Arrival date & time: 07/05/20  0003     History Chief Complaint  Patient presents with  . Flank Pain    Matthew Mcdowell is a 60 y.o. male.  The history is provided by the patient.  Flank Pain This is a new problem. The current episode started 12 to 24 hours ago. The problem occurs constantly. The problem has been rapidly worsening. Associated symptoms include abdominal pain. Pertinent negatives include no chest pain. Exacerbated by: palpation. Nothing relieves the symptoms. He has tried nothing for the symptoms.  Patient reports sudden onset of left flank pain that radiates to his testicles. No fevers or vomiting.  He reports he thinks he may have a kidney stone.     Past Medical History:  Diagnosis Date  . Diabetes ([place])   . Hypercholesterolemia   . Hypertension     Patient Active Problem List   Diagnosis Date Noted  . Bilateral impacted cerumen 04/03/2020  . Pain in joint of right shoulder 06/17/2018  . Family history of colon cancer 02/03/2013    Past Surgical History:  Procedure Laterality Date  . COLONOSCOPY N/A 03/08/2013   Procedure: COLONOSCOPY;  Surgeon: Danie Binder, MD;  [facility];  Service: Endoscopy;  Laterality: N/A;  9:30  . SHOULDER ARTHROSCOPY WITH ROTATOR CUFF REPAIR AND SUBACROMIAL DECOMPRESSION Right 12/31/2018   Procedure: RIGHT SHOULDER ARTHROSCOPY WITH ROTATOR CUFF REPAIR AND SUBACROMIAL DECOMPRESSION, DISTAL CLAVICULECTOMY, EXTENSIVE DEBRIDEMENT;  Surgeon: Hiram Gash, MD;  Location: [place];  Service: Orthopedics;  Laterality: Right;  . TOTAL HIP ARTHROPLASTY     right       Family History  Problem Relation Age of Onset  . Cancer Mother 84       VULVAR  . Breast cancer Mother     Social History   Tobacco Use  . Smoking status: Former Research scientist (life sciences)  . Smokeless tobacco: Never Used  Substance Use Topics  . Alcohol use: Yes    Alcohol/week:  0.0 standard drinks    Comment: very little  . Drug use: No    Home Medications Prior to Admission medications   Medication Sig Start Date End Date Taking? Authorizing Provider  aspirin EC 81 MG tablet Take 81 mg by mouth daily.    [provider]  atorvastatin (LIPITOR) 20 MG tablet Take 20 mg by mouth daily.    [provider]  canagliflozin (INVOKANA) 300 MG TABS tablet Take 300 mg by mouth daily.     [provider]  lisinopril (PRINIVIL,ZESTRIL) 10 MG tablet Take 10 mg by mouth at bedtime.  12/24/12   [provider]  metFORMIN (GLUCOPHAGE) 1000 MG tablet Take 1,000 mg by mouth 2 (two) times daily with a meal.  11/08/12   [provider]  Probiotic Product (PROBIOTIC PO) Take 1 capsule by mouth daily.    [provider]  tamsulosin (FLOMAX) 0.4 MG CAPS capsule Take 1 capsule (0.4 mg total) by mouth daily. 01/07/18   Henderly, Britni A, PA-C    Allergies    Patient has no known allergies.  Review of Systems   Review of Systems  Constitutional: Negative for fever.  Cardiovascular: Negative for chest pain.  Gastrointestinal: Positive for abdominal pain.  Genitourinary: Positive for flank pain and testicular pain.  All other systems reviewed and are negative.   Physical Exam Updated Vital Signs BP (!) 186/111   Pulse 75   Temp 97.9  F (36.6 C) (Oral)   Resp 18   Ht 1.702 m (5\' 7" )   Wt 86.2 kg   SpO2 96%   BMI 29.76 kg/m   Physical Exam CONSTITUTIONAL: Well developed/well nourished, uncomfortable appearing. HEAD: Normocephalic/atraumatic EYES: EOMI/PERRL ENMT: Mucous membranes moist NECK: supple no meningeal signs SPINE/BACK:entire spine nontender CV: S1/S2 noted, no murmurs/rubs/gallops noted LUNGS: Lungs are clear to auscultation bilaterally, no apparent distress ABDOMEN: soft, nontender, no rebound or guarding, bowel sounds noted throughout abdomen GU: Left cva tenderness, testicles descended bilaterally without  tenderness.  No obvious hernia noted.  Nurse present for exam NEURO: Pt is awake/alert/appropriate, moves all extremitiesx4.  No facial droop.   EXTREMITIES: pulses normal/equal, full ROM SKIN: warm, color normal PSYCH: Anxious  ED Results / Procedures / Treatments   Labs (all labs ordered are listed, but only abnormal results are displayed) Labs Reviewed  URINALYSIS, ROUTINE W REFLEX MICROSCOPIC - Abnormal; Notable for the following components:      Result Value   Color, Urine STRAW (*)    Glucose, UA >=500 (*)    All other components within normal limits  BASIC METABOLIC PANEL - Abnormal; Notable for the following components:   Glucose, Bld 171 (*)    BUN 23 (*)    All other components within normal limits  CBC WITH DIFFERENTIAL/PLATELET - Abnormal; Notable for the following components:   MCV 100.2 (*)    All other components within normal limits    EKG None  Radiology CT Renal Stone Study  Result Date: 07/05/2020 CLINICAL DATA:  Flank pain.  Left.  Kidney stone suspected. EXAM: CT ABDOMEN AND PELVIS WITHOUT CONTRAST TECHNIQUE: Multidetector CT imaging of the abdomen and pelvis was performed following the standard protocol without IV contrast. COMPARISON:  None. FINDINGS: Lower chest: Coronary artery calcifications. No acute abnormality. Small hiatal hernia. Hepatobiliary: No focal liver abnormality. No gallstones, gallbladder wall thickening, or pericholecystic fluid. No biliary dilatation. Pancreas: No focal lesion. Normal pancreatic contour. No surrounding inflammatory changes. No main pancreatic ductal dilatation. Hip Normal in size without focal abnormality. Spleen: Normal in size without focal abnormality. Adrenals/Urinary Tract: No adrenal nodule bilaterally. Bilateral perinephric stranding nonspecific, left greater than right. Mild hydroureteronephrosis on the left. Associated distal ureter 3 mm calcified stone. Couple of 3 mm calcified stones within the left kidney.  Extrarenal left renal pelvis again noted. Couple of punctate calcified stones within the right kidney. No right ureterolithiasis. No right hydroureteronephrosis. Limited evaluation of the urinary bladder due to streak artifact originating from the femoral surgical hardware in the right. The urinary bladder is unremarkable. Stomach/Bowel: Stomach is within normal limits. No evidence of bowel wall thickening or dilatation. Scattered colonic diverticulosis. Appendix appears normal. Vascular/Lymphatic: No abdominal aorta or iliac aneurysm. Severe atherosclerotic plaque of the aorta and its branches. No abdominal, pelvic, or inguinal lymphadenopathy. Reproductive: Not visualized this streak artifact originating from the lumen of the right hip arthroplasty. Other: No intraperitoneal free fluid. No intraperitoneal free gas. No organized fluid collection. Musculoskeletal: No abdominal wall hernia or abnormality. No suspicious lytic or blastic osseous lesions. No acute displaced fracture. Multilevel degenerative changes of the spine. Partially visualized total right hip arthroplasty. IMPRESSION: 1. Obstructive 3 mm distal left ureteral stone. Correlate with urinalysis for superimposed infection. 2. Nonobstructive bilateral nephrolithiasis (3 mm on the left, punctate on the right). 3. Scattered colonic diverticulosis with no acute diverticulitis. 4. Small hiatal hernia. Electronically Signed   By: Iven Finn M.D.   On: 07/05/2020 01:40  Procedures Procedures   Medications Ordered in ED Medications  fentaNYL (SUBLIMAZE) injection 100 mcg (100 mcg Intravenous Given 07/05/20 0057)  ondansetron (ZOFRAN) injection 4 mg (4 mg Intravenous Given 07/05/20 0057)  fentaNYL (SUBLIMAZE) injection 100 mcg (100 mcg Intravenous Given 07/05/20 0212)  ketorolac (TORADOL) 30 MG/ML injection 30 mg (30 mg Intravenous Given 07/05/20 0212)    ED Course  I have reviewed the triage vital signs and the nursing notes.  Pertinent  labs & imaging results that were available during my care of the patient were reviewed by me and considered in my medical decision making (see chart for details).    MDM Rules/Calculators/A&P                          1:01 AM No recent CT imaging to evaluate for ureteral stones.  Will obtain labs as well as CT imaging 2:22 AM Patient found to have 3 mm distal ureteral stone No signs of infection.  Will give pain control and reassess 2:50 AM Patient feeling improved.  Will refer to urology.  He is already on Flomax at home Final Clinical Impression(s) / ED Diagnoses Final diagnoses:  Kidney stone  Left ureteral stone    Rx / DC Orders ED Discharge Orders         Ordered    oxyCODONE-acetaminophen (PERCOCET) 5-325 MG tablet  Every 8 hours PRN        07/05/20 0249           Ripley Fraise, MD 07/05/20 [phone]

## 2020-07-05 NOTE — ED Notes (Signed)
Chaperoned testicular exam by EDP. Orders placed for pain medication, orders verified verbally with Dr. Christy Gentles for 143mcg fentanyl to be administered IV STAT.

## 2020-07-05 NOTE — ED Triage Notes (Signed)
Pt c/o left flank pain that started in last 24 hours.

## 2021-05-08 ENCOUNTER — Other Ambulatory Visit: Payer: Self-pay

## 2021-05-08 ENCOUNTER — Ambulatory Visit: Payer: 59 | Admitting: Urology

## 2021-05-08 ENCOUNTER — Encounter: Payer: Self-pay | Admitting: Urology

## 2021-05-08 VITALS — BP 129/89 | HR 78 | Wt 192.0 lb

## 2021-05-08 DIAGNOSIS — N200 Calculus of kidney: Secondary | ICD-10-CM | POA: Diagnosis not present

## 2021-05-08 NOTE — Progress Notes (Signed)
H&P  Chief Complaint: Kidney stones  History of Present Illness: 61 year old male self-referred for evaluation and management of recurrent urolithiasis.  He states that he is passed quite a few stones.  He has never needed urologic intervention for any of these.  His most recent visit for stone management was in late March 2022.  At that time he had a small left distal ureteral stone, bilateral renal calculi.  2 calculi on the right, 1 in the upper and one of the lower pole, both approximately 1 to 2 mm.  2 calculi in the left kidney, 1 in the lower and 1 in the upper pole, both approximately 3 to 4 mm.  No current urinary issues.  He does drink water with lemon juice in it, he has been doing this every day for the past couple of weeks.  Serum calcium levels during recent hospital visits have been normal.  Past Medical History:  Diagnosis Date   Diabetes ([place])    Hypercholesterolemia    Hypertension     Past Surgical History:  Procedure Laterality Date   COLONOSCOPY N/A 03/08/2013   Procedure: COLONOSCOPY;  Surgeon: Danie Binder, MD;  [facility];  Service: Endoscopy;  Laterality: N/A;  9:30   SHOULDER ARTHROSCOPY WITH ROTATOR CUFF REPAIR AND SUBACROMIAL DECOMPRESSION Right 12/31/2018   Procedure: RIGHT SHOULDER ARTHROSCOPY WITH ROTATOR CUFF REPAIR AND SUBACROMIAL DECOMPRESSION, DISTAL CLAVICULECTOMY, EXTENSIVE DEBRIDEMENT;  Surgeon: Hiram Gash, MD;  Location: [place];  Service: Orthopedics;  Laterality: Right;   TOTAL HIP ARTHROPLASTY     right    Home Medications:  Allergies as of 05/08/2021   No Known Allergies      Medication List        Accurate as of May 08, 2021  8:03 AM. If you have any questions, ask your nurse or doctor.          aspirin EC 81 MG tablet Take 81 mg by mouth daily.   atorvastatin 20 MG tablet Commonly known as: LIPITOR Take 20 mg by mouth daily.   Invokana 300 MG Tabs tablet Generic drug:  canagliflozin Take 300 mg by mouth daily.   lisinopril 10 MG tablet Commonly known as: ZESTRIL Take 10 mg by mouth at bedtime.   metFORMIN 1000 MG tablet Commonly known as: GLUCOPHAGE Take 1,000 mg by mouth 2 (two) times daily with a meal.   oxyCODONE-acetaminophen 5-325 MG tablet Commonly known as: Percocet Take 1 tablet by mouth every 8 (eight) hours as needed for severe pain.   PROBIOTIC PO Take 1 capsule by mouth daily.   tamsulosin 0.4 MG Caps capsule Commonly known as: FLOMAX Take 1 capsule (0.4 mg total) by mouth daily.        Allergies: No Known Allergies  Family History  Problem Relation Age of Onset   Cancer Mother 46       VULVAR   Breast cancer Mother     Social History:  reports that he has quit smoking. He has never used smokeless tobacco. He reports current alcohol use. He reports that he does not use drugs.  ROS: A complete review of systems was performed.  All systems are negative except for pertinent findings as noted.  Physical Exam:  Vital signs in last 24 hours: There were no vitals taken for this visit. Constitutional:  Alert and oriented, No acute distress Cardiovascular: Regular rate  Respiratory: Normal respiratory effort Neurologic: Grossly intact, no focal deficits Psychiatric: Normal mood and affect  I have reviewed prior pt notes  I have reviewed urinalysis results  I have independently reviewed prior imaging-I reviewed CT images with the patient  I have reviewed recent basic metabolic panels and CBC results   Impression/Assessment:  Recurrent urolithiasis, last CT in March 2022 revealed bilateral renal calculi.  He passed his ureteral stone  Plan:  1.  I discussed stone management with him, mainly stone prevention  2.  I will send him home with a 24-hour urine request.  I will send a message over MyChart with results as well as preventive measures  3.  Otherwise office visit as needed

## 2021-05-08 NOTE — Progress Notes (Signed)

## 2021-05-09 LAB — URINALYSIS, ROUTINE W REFLEX MICROSCOPIC
Bilirubin, UA: NEGATIVE
Leukocytes,UA: NEGATIVE
Nitrite, UA: NEGATIVE
Protein,UA: NEGATIVE
RBC, UA: NEGATIVE
Specific Gravity, UA: 1.015 (ref 1.005–1.030)
Urobilinogen, Ur: 0.2 mg/dL (ref 0.2–1.0)
pH, UA: 5.5 (ref 5.0–7.5)

## 2021-05-09 LAB — MICROSCOPIC EXAMINATION
Bacteria, UA: NONE SEEN
RBC, Urine: NONE SEEN /hpf (ref 0–2)
Renal Epithel, UA: NONE SEEN /hpf

## 2021-06-04 ENCOUNTER — Other Ambulatory Visit: Payer: Self-pay | Admitting: Urology

## 2021-06-07 ENCOUNTER — Telehealth: Payer: Self-pay

## 2021-06-07 NOTE — Progress Notes (Signed)
Sent to patient via mail.

## 2021-06-07 NOTE — Telephone Encounter (Signed)
Patient called and made aware.

## 2021-06-07 NOTE — Telephone Encounter (Signed)
-----   Message from Franchot Gallo, MD sent at 06/07/2021 12:30 PM EST ----- ?Let patient know that I reviewed his 24-hour urine results.  Overall, he is drinking enough fluids to limit his risk of stones.  The calcium in his urine is a bit high.  This is mostly because his sodium/salt intake is too high.  If he limits his sodium/salt intake, he will limit his calcium intake and that will significantly limit his stone growth. ?----- Message ----- ?From: Dorisann Frames, RN ?Sent: 06/04/2021   2:01 PM EST ?To: Franchot Gallo, MD, [facility], LPN ? ?24 hr urine ? ?

## 2021-06-21 ENCOUNTER — Other Ambulatory Visit: Payer: Self-pay | Admitting: Urology

## 2022-06-12 ENCOUNTER — Other Ambulatory Visit (HOSPITAL_COMMUNITY): Payer: Self-pay | Admitting: Internal Medicine

## 2022-06-12 DIAGNOSIS — R109 Unspecified abdominal pain: Secondary | ICD-10-CM

## 2022-07-11 ENCOUNTER — Other Ambulatory Visit (HOSPITAL_COMMUNITY): Payer: Self-pay

## 2022-07-11 MED ORDER — TRULICITY 1.5 MG/0.5ML ~~LOC~~ SOAJ
1.5000 mg | SUBCUTANEOUS | 12 refills | Status: AC
  Filled 2022-07-11: qty 2, 28d supply, fill #0
  Filled 2022-09-05: qty 6, 84d supply, fill #0
  Filled 2023-02-13: qty 2, 28d supply, fill #0
  Filled 2023-03-12: qty 2, 28d supply, fill #1

## 2022-07-15 ENCOUNTER — Other Ambulatory Visit (HOSPITAL_COMMUNITY): Payer: Self-pay

## 2022-07-15 MED ORDER — TRULICITY 1.5 MG/0.5ML ~~LOC~~ SOAJ
1.5000 mg | SUBCUTANEOUS | 12 refills | Status: DC
  Filled 2022-07-15 (×2): qty 2, 28d supply, fill #0
  Filled 2022-08-08 (×2): qty 2, 28d supply, fill #1

## 2022-07-15 MED ORDER — TRULICITY 0.75 MG/0.5ML ~~LOC~~ SOAJ
0.7500 mg | SUBCUTANEOUS | 12 refills | Status: DC
  Filled 2022-07-15: qty 2, 28d supply, fill #0

## 2022-07-24 ENCOUNTER — Ambulatory Visit (HOSPITAL_COMMUNITY): Admission: RE | Admit: 2022-07-24 | Payer: No Typology Code available for payment source | Source: Ambulatory Visit

## 2022-08-01 ENCOUNTER — Other Ambulatory Visit (HOSPITAL_COMMUNITY): Payer: Self-pay

## 2022-08-01 ENCOUNTER — Ambulatory Visit (HOSPITAL_COMMUNITY)
Admission: RE | Admit: 2022-08-01 | Discharge: 2022-08-01 | Disposition: A | Discharge status: Home / Self Care | Payer: No Typology Code available for payment source | Source: Ambulatory Visit | Attending: Internal Medicine | Admitting: Internal Medicine

## 2022-08-01 DIAGNOSIS — R109 Unspecified abdominal pain: Secondary | ICD-10-CM | POA: Diagnosis not present

## 2022-08-07 ENCOUNTER — Telehealth: Payer: Self-pay

## 2022-08-07 NOTE — Telephone Encounter (Signed)
Patient is needing an appointment with Dr. Retta Diones. No available appointments.  He has been having left back and testicle pain.  Please advise.

## 2022-08-08 ENCOUNTER — Other Ambulatory Visit (HOSPITAL_COMMUNITY): Payer: Self-pay

## 2022-08-12 ENCOUNTER — Telehealth: Payer: Self-pay

## 2022-08-12 NOTE — Telephone Encounter (Signed)
Patient calling back regarding the left back and testicle pain.   Patient was made an appointment in June (1st available).  Can he possibly be worked in sooner?  Please advise.

## 2022-08-12 NOTE — Telephone Encounter (Signed)
Dr. Ouida Sills called and requested you contact him at your earliest convenience regarding your mutual patient Mr. Deman.     Thank you

## 2022-08-12 NOTE — Telephone Encounter (Signed)
See prior messages below. Please see CT results from 4/25. Patient has f/u 6/04. Ok to wait until then?

## 2022-08-14 ENCOUNTER — Telehealth: Payer: Self-pay

## 2022-08-14 NOTE — Telephone Encounter (Signed)
Patient is aware that he has a follow up appointment scheduled with Dr. Retta Diones and he has the first available  appt. Patient voiced understanding

## 2022-08-14 NOTE — Telephone Encounter (Signed)
Dr. Ouida Sills called back requesting you contact him at your earliest convenience to discuss your mutual patient Matthew Mcdowell. He can be reached at [phone].    Thank you

## 2022-08-19 NOTE — Progress Notes (Signed)
History of Present Illness: 62 year old male sent back to see [place] by Dr. Ouida Sills.  He has a history of urolithiasis.  Recent CT scan from late April performed for leftt flank and groin pain revealed bilateral renal stones, largest 3 mm.  No hydronephrosis or ureteral calculi.  Reading revealed hyperdense bladder contents although over the bladder was obscured from hip prostheses.  He is still having pain in the left flank and testicle.  Not so bad laying down, worse sitting and standing.  No exacerbating relieving factors.  It has been there for several months.   Past Medical History:  Diagnosis Date   Diabetes (HCC)    Hypercholesterolemia    Hypertension     Past Surgical History:  Procedure Laterality Date   COLONOSCOPY N/A 03/08/2013   Procedure: COLONOSCOPY;  Surgeon: West Bali, MD;  [facility]: [facility];  Service: Endoscopy;  Laterality: N/A;  9:30   SHOULDER ARTHROSCOPY WITH ROTATOR CUFF REPAIR AND SUBACROMIAL DECOMPRESSION Right 12/31/2018   Procedure: RIGHT SHOULDER ARTHROSCOPY WITH ROTATOR CUFF REPAIR AND SUBACROMIAL DECOMPRESSION, DISTAL CLAVICULECTOMY, EXTENSIVE DEBRIDEMENT;  Surgeon: Bjorn Pippin, MD;  Location: [facility];  Service: Orthopedics;  Laterality: Right;   TOTAL HIP ARTHROPLASTY     right    Home Medications:  Allergies as of 08/20/2022   No Known Allergies      Medication List        Accurate as of Aug 19, 2022  8:40 PM. If you have any questions, ask your nurse or doctor.          aspirin EC 81 MG tablet Take 81 mg by mouth daily.   atorvastatin 20 MG tablet Commonly known as: LIPITOR Take 20 mg by mouth daily.   canagliflozin 300 MG Tabs tablet Commonly known as: INVOKANA Take 300 mg by mouth daily.   lisinopril 10 MG tablet Commonly known as: ZESTRIL Take 10 mg by mouth at bedtime.   metFORMIN 1000 MG tablet Commonly known as: GLUCOPHAGE Take 1,000 mg by mouth 2 (two) times daily with a meal.    oxyCODONE-acetaminophen 5-325 MG tablet Commonly known as: Percocet Take 1 tablet by mouth every 8 (eight) hours as needed for severe pain.   PROBIOTIC PO Take 1 capsule by mouth daily.   tamsulosin 0.4 MG Caps capsule Commonly known as: FLOMAX Take 1 capsule (0.4 mg total) by mouth daily.   Trulicity 1.5 MG/0.5ML Sopn Generic drug: Dulaglutide Inject 1.5 mg into the skin once a week.   Trulicity 0.75 MG/0.5ML Sopn Generic drug: Dulaglutide Inject 0.75 mg into the skin once a week.   Trulicity 1.5 MG/0.5ML Sopn Generic drug: Dulaglutide Inject 1.5 mg into the skin once a week.        Allergies: No Known Allergies  Family History  Problem Relation Age of Onset   Cancer Mother 55       VULVAR   Breast cancer Mother     Social History:  reports that he has quit smoking. He has never used smokeless tobacco. He reports current alcohol use. He reports that he does not use drugs.  ROS: A complete review of systems was performed.  All systems are negative except for pertinent findings as noted.  Physical Exam:  Vital signs in last 24 hours: There were no vitals taken for this visit. Constitutional:  Alert and oriented, No acute distress Cardiovascular: Regular rate  Respiratory: Normal respiratory effort GI: Abdomen is obese.  Reducible left inguinal hernia present. Genitourinary: Normal male  phallus, testes are descended bilaterally and non-tender and without masses, scrotum is normal in appearance without lesions or masses, perineum is normal on inspection. Lymphatic: No lymphadenopathy Neurologic: Grossly intact, no focal deficits Psychiatric: Normal mood and affect  I have reviewed prior pt notes  I have reviewed notes from referring/previous physicians--Dr Ouida Sills  I have reviewed urinalysis results  I have independently reviewed prior imaging--recent CT A/P     Impression/Assessment:  Left lower quadrant/testicular pain-I think probably from inguinal  hernia  Abnormality on bladder on CT scan.  I do not appreciate this, the patient also has clear urine  Plan:  I do not think he needs cystoscopy today  I will refer him to [facility] surgical for evaluation and management of left inguinal hernia.

## 2022-08-20 ENCOUNTER — Encounter: Payer: Self-pay | Admitting: Urology

## 2022-08-20 ENCOUNTER — Ambulatory Visit: Payer: No Typology Code available for payment source | Admitting: Urology

## 2022-08-20 VITALS — BP 120/80 | HR 87

## 2022-08-20 DIAGNOSIS — K4090 Unilateral inguinal hernia, without obstruction or gangrene, not specified as recurrent: Secondary | ICD-10-CM

## 2022-08-20 DIAGNOSIS — N50819 Testicular pain, unspecified: Secondary | ICD-10-CM | POA: Diagnosis not present

## 2022-08-20 DIAGNOSIS — R1032 Left lower quadrant pain: Secondary | ICD-10-CM | POA: Diagnosis not present

## 2022-08-20 DIAGNOSIS — N200 Calculus of kidney: Secondary | ICD-10-CM

## 2022-08-20 DIAGNOSIS — R9341 Abnormal radiologic findings on diagnostic imaging of renal pelvis, ureter, or bladder: Secondary | ICD-10-CM

## 2022-08-20 LAB — URINALYSIS, ROUTINE W REFLEX MICROSCOPIC
Bilirubin, UA: NEGATIVE
Ketones, UA: NEGATIVE
Leukocytes,UA: NEGATIVE
Nitrite, UA: NEGATIVE
Protein,UA: NEGATIVE
RBC, UA: NEGATIVE
Specific Gravity, UA: 1.015 (ref 1.005–1.030)
Urobilinogen, Ur: 0.2 mg/dL (ref 0.2–1.0)
pH, UA: 5.5 (ref 5.0–7.5)

## 2022-09-03 ENCOUNTER — Encounter: Payer: Self-pay | Admitting: Surgery

## 2022-09-03 ENCOUNTER — Ambulatory Visit: Payer: No Typology Code available for payment source | Admitting: Surgery

## 2022-09-03 VITALS — BP 114/79 | HR 87 | Temp 98.0°F | Resp 14 | Ht 67.0 in | Wt 187.0 lb

## 2022-09-03 DIAGNOSIS — K4020 Bilateral inguinal hernia, without obstruction or gangrene, not specified as recurrent: Secondary | ICD-10-CM | POA: Diagnosis not present

## 2022-09-03 NOTE — Progress Notes (Signed)
[facility] History and Physical  Reason for Referral: Left inguinal hernia Referring Physician: Dr. Retta Diones  Chief Complaint   New Patient (Initial Visit)     Matthew Mcdowell is a 62 y.o. male.  HPI: Patient presents for evaluation of a left inguinal hernia.  He has been having left groin pain that radiates down into his scrotum and penis since November.  He was initially evaluated by urology and was noted to have kidney stones within the kidneys, that should not be causing significant issues.  At the time of the evaluation by the urologist, he had significant pain on hernia evaluation.  He underwent a CT of the abdomen and pelvis which demonstrated small bilateral fat-containing inguinal hernias.  He denies any nausea and vomiting.  He is having regular bowel movements every other day.  His past medical history significant for hypertension, kidney stones, and diabetes on Trulicity.  He takes an 81 mg aspirin daily.  He denies any history of abdominal surgeries.  He has had bilateral shoulders operated on and a right hip replacement.  He denies use of tobacco products and occasionally will drink alcohol.  He denies use of illicit drugs.  Past Medical History:  Diagnosis Date   Diabetes (HCC)    Hypercholesterolemia    Hypertension     Past Surgical History:  Procedure Laterality Date   COLONOSCOPY N/A 03/08/2013   Procedure: COLONOSCOPY;  Surgeon: West Bali, MD;  [facility];  Service: Endoscopy;  Laterality: N/A;  9:30   SHOULDER ARTHROSCOPY WITH ROTATOR CUFF REPAIR AND SUBACROMIAL DECOMPRESSION Right 12/31/2018   Procedure: RIGHT SHOULDER ARTHROSCOPY WITH ROTATOR CUFF REPAIR AND SUBACROMIAL DECOMPRESSION, DISTAL CLAVICULECTOMY, EXTENSIVE DEBRIDEMENT;  Surgeon: Bjorn Pippin, MD;  Location: [facility];  Service: Orthopedics;  Laterality: Right;   TOTAL HIP ARTHROPLASTY     right    Family History  Problem Relation Age of Onset    Cancer Mother 31       VULVAR   Breast cancer Mother     Social History   Tobacco Use   Smoking status: Former   Smokeless tobacco: Never  Substance Use Topics   Alcohol use: Yes    Alcohol/week: 0.0 standard drinks of alcohol    Comment: very little   Drug use: No    Medications: I have reviewed the patient's current medications. Allergies as of 09/03/2022   No Known Allergies      Medication List        Accurate as of Sep 03, 2022  9:10 AM. If you have any questions, ask your nurse or doctor.          STOP taking these medications    lisinopril 10 MG tablet Commonly known as: ZESTRIL Stopped by: Marc Leichter A Emeline Simpson, DO   oxyCODONE-acetaminophen 5-325 MG tablet Commonly known as: Percocet Stopped by: Dariusz Brase A Saleah Rishel, DO   tamsulosin 0.4 MG Caps capsule Commonly known as: FLOMAX Stopped by: Geza Beranek A Dakari Cregger, DO       TAKE these medications    aspirin EC 81 MG tablet Take 81 mg by mouth daily.   atorvastatin 20 MG tablet Commonly known as: LIPITOR Take 20 mg by mouth daily.   canagliflozin 300 MG Tabs tablet Commonly known as: INVOKANA Take 300 mg by mouth daily.   metFORMIN 1000 MG tablet Commonly known as: GLUCOPHAGE Take 1,000 mg by mouth 2 (two) times daily with a meal.   PROBIOTIC PO Take 1 capsule by mouth  daily.   Trulicity 1.5 MG/0.5ML Sopn Generic drug: Dulaglutide Inject 1.5 mg into the skin once a week.         ROS:  Constitutional: negative for chills, fatigue, and fevers Eyes: negative for visual disturbance and pain Ears, nose, mouth, throat, and face: negative for ear drainage, sore throat, and sinus problems Respiratory: positive for cough, negative for wheezing and is of breath Cardiovascular: negative for chest pain and palpitations Gastrointestinal: positive for reflux symptoms, negative for abdominal pain, nausea, and vomiting Genitourinary:positive for dysuria and frequency Integument/breast:  negative for dryness and rash Hematologic/lymphatic: negative for bleeding and lymphadenopathy Musculoskeletal:negative for back pain and neck pain Neurological: negative for dizziness and tremors Endocrine: negative for temperature intolerance  Blood pressure 114/79, pulse 87, temperature 98 F (36.7 C), temperature source Oral, resp. rate 14, height 5\' 7"  (1.702 m), weight 187 lb (84.8 kg), SpO2 93 %. Physical Exam Vitals reviewed.  Constitutional:      Appearance: Normal appearance.  HENT:     Head: Normocephalic and atraumatic.  Eyes:     Extraocular Movements: Extraocular movements intact.     Pupils: Pupils are equal, round, and reactive to light.  Cardiovascular:     Rate and Rhythm: Normal rate and regular rhythm.  Pulmonary:     Effort: Pulmonary effort is normal.     Breath sounds: Normal breath sounds.  Abdominal:     Comments: Abdomen soft, nondistended, no percussion tenderness, nontender to palpation; no rigidity, guarding, rebound tenderness; soft and reducible left inguinal hernia, no significant right inguinal hernia  Musculoskeletal:        General: Normal range of motion.     Cervical back: Normal range of motion.  Skin:    General: Skin is warm and dry.  Neurological:     General: No focal deficit present.     Mental Status: He is alert and oriented to person, place, and time.  Psychiatric:        Mood and Affect: Mood normal.        Behavior: Behavior normal.     Results: CT abdomen and pelvis (08/06/2022): IMPRESSION: 1. Bilateral nonobstructing nephrolithiasis. No hydronephrosis or obstructive uropathy. 2. Hyperdense bladder contents, may be due to blood products or proteinaceous material. The bladder is partially obscured by streak artifact from right hip arthroplasty in the not well assessed. Recommend correlation with urinalysis. 3. Colonic diverticulosis without diverticulitis. 4. Small fat containing bilateral inguinal hernias.   Aortic  Atherosclerosis (ICD10-I70.0).  Assessment & Plan:  Matthew Mcdowell is a 62 y.o. male who presents for evaluation of left inguinal hernia.  -I explained the pathophysiology of hernias, and why we recommend surgical repair.  He has pain in his left groin, which could be secondary to the hernia, though I did explain that the pain in his back is likely not related to his hernia.  There is a chance that he could still have some pain in his left groin if the pain is not secondary to his hernia.  Given his imaging also demonstrated bilateral inguinal hernias, we will plan to repair both at the time of surgery -The risk and benefits of robotic assisted laparoscopic bilateral inguinal hernia repair with mesh were discussed including but not limited to bleeding, infection, injury to surrounding structures, need for additional procedures, and hernia recurrence.  After careful consideration, Chandara Rorrer has decided to proceed with surgery. -Patient tentatively scheduled for surgery on 6/20 -Information provided to the patient regarding inguinal hernias -Advised that he  needs to present to the emergency department if he begins to have nausea, vomiting, nonreducible painful bulge in his groin, and obstipation  All questions were answered to the satisfaction of the patient.  Theophilus Kinds, DO [facility] [address] [place], [place] [address] [phone] (office)

## 2022-09-03 NOTE — H&P (Signed)
[facility] History and Physical  Reason for Referral: Left inguinal hernia Referring Physician: Dr. Dahlstedt  Chief Complaint   New Patient (Initial Visit)     Matthew Mcdowell is a 61 y.o. male.  HPI: Patient presents for evaluation of a left inguinal hernia.  He has been having left groin pain that radiates down into his scrotum and penis since November.  He was initially evaluated by urology and was noted to have kidney stones within the kidneys, that should not be causing significant issues.  At the time of the evaluation by the urologist, he had significant pain on hernia evaluation.  He underwent a CT of the abdomen and pelvis which demonstrated small bilateral fat-containing inguinal hernias.  He denies any nausea and vomiting.  He is having regular bowel movements every other day.  His past medical history significant for hypertension, kidney stones, and diabetes on Trulicity.  He takes an 81 mg aspirin daily.  He denies any history of abdominal surgeries.  He has had bilateral shoulders operated on and a right hip replacement.  He denies use of tobacco products and occasionally will drink alcohol.  He denies use of illicit drugs.  Past Medical History:  Diagnosis Date   Diabetes (HCC)    Hypercholesterolemia    Hypertension     Past Surgical History:  Procedure Laterality Date   COLONOSCOPY N/A 03/08/2013   Procedure: COLONOSCOPY;  Surgeon: Sandi L Fields, MD;  [facility];  Service: Endoscopy;  Laterality: N/A;  9:30   SHOULDER ARTHROSCOPY WITH ROTATOR CUFF REPAIR AND SUBACROMIAL DECOMPRESSION Right 12/31/2018   Procedure: RIGHT SHOULDER ARTHROSCOPY WITH ROTATOR CUFF REPAIR AND SUBACROMIAL DECOMPRESSION, DISTAL CLAVICULECTOMY, EXTENSIVE DEBRIDEMENT;  Surgeon: Varkey, Dax T, MD;  Location: [facility];  Service: Orthopedics;  Laterality: Right;   TOTAL HIP ARTHROPLASTY     right    Family History  Problem Relation Age of Onset    Cancer Mother 40       VULVAR   Breast cancer Mother     Social History   Tobacco Use   Smoking status: Former   Smokeless tobacco: Never  Substance Use Topics   Alcohol use: Yes    Alcohol/week: 0.0 standard drinks of alcohol    Comment: very little   Drug use: No    Medications: I have reviewed the patient's current medications. Allergies as of 09/03/2022   No Known Allergies      Medication List        Accurate as of Sep 03, 2022  9:10 AM. If you have any questions, ask your nurse or doctor.          STOP taking these medications    lisinopril 10 MG tablet Commonly known as: ZESTRIL Stopped by: Chester Romero A Charls Custer, DO   oxyCODONE-acetaminophen 5-325 MG tablet Commonly known as: Percocet Stopped by: Sherry Rogus A Noris Kulinski, DO   tamsulosin 0.4 MG Caps capsule Commonly known as: FLOMAX Stopped by: Becky Colan A Etsuko Dierolf, DO       TAKE these medications    aspirin EC 81 MG tablet Take 81 mg by mouth daily.   atorvastatin 20 MG tablet Commonly known as: LIPITOR Take 20 mg by mouth daily.   canagliflozin 300 MG Tabs tablet Commonly known as: INVOKANA Take 300 mg by mouth daily.   metFORMIN 1000 MG tablet Commonly known as: GLUCOPHAGE Take 1,000 mg by mouth 2 (two) times daily with a meal.   PROBIOTIC PO Take 1 capsule by mouth   daily.   Trulicity 1.5 MG/0.5ML Sopn Generic drug: Dulaglutide Inject 1.5 mg into the skin once a week.         ROS:  Constitutional: negative for chills, fatigue, and fevers Eyes: negative for visual disturbance and pain Ears, nose, mouth, throat, and face: negative for ear drainage, sore throat, and sinus problems Respiratory: positive for cough, negative for wheezing and is of breath Cardiovascular: negative for chest pain and palpitations Gastrointestinal: positive for reflux symptoms, negative for abdominal pain, nausea, and vomiting Genitourinary:positive for dysuria and frequency Integument/breast:  negative for dryness and rash Hematologic/lymphatic: negative for bleeding and lymphadenopathy Musculoskeletal:negative for back pain and neck pain Neurological: negative for dizziness and tremors Endocrine: negative for temperature intolerance  Blood pressure 114/79, pulse 87, temperature 98 F (36.7 C), temperature source Oral, resp. rate 14, height 5' 7" (1.702 m), weight 187 lb (84.8 kg), SpO2 93 %. Physical Exam Vitals reviewed.  Constitutional:      Appearance: Normal appearance.  HENT:     Head: Normocephalic and atraumatic.  Eyes:     Extraocular Movements: Extraocular movements intact.     Pupils: Pupils are equal, round, and reactive to light.  Cardiovascular:     Rate and Rhythm: Normal rate and regular rhythm.  Pulmonary:     Effort: Pulmonary effort is normal.     Breath sounds: Normal breath sounds.  Abdominal:     Comments: Abdomen soft, nondistended, no percussion tenderness, nontender to palpation; no rigidity, guarding, rebound tenderness; soft and reducible left inguinal hernia, no significant right inguinal hernia  Musculoskeletal:        General: Normal range of motion.     Cervical back: Normal range of motion.  Skin:    General: Skin is warm and dry.  Neurological:     General: No focal deficit present.     Mental Status: He is alert and oriented to person, place, and time.  Psychiatric:        Mood and Affect: Mood normal.        Behavior: Behavior normal.     Results: CT abdomen and pelvis (08/06/2022): IMPRESSION: 1. Bilateral nonobstructing nephrolithiasis. No hydronephrosis or obstructive uropathy. 2. Hyperdense bladder contents, may be due to blood products or proteinaceous material. The bladder is partially obscured by streak artifact from right hip arthroplasty in the not well assessed. Recommend correlation with urinalysis. 3. Colonic diverticulosis without diverticulitis. 4. Small fat containing bilateral inguinal hernias.   Aortic  Atherosclerosis (ICD10-I70.0).  Assessment & Plan:  Matthew Mcdowell is a 61 y.o. male who presents for evaluation of left inguinal hernia.  -I explained the pathophysiology of hernias, and why we recommend surgical repair.  He has pain in his left groin, which could be secondary to the hernia, though I did explain that the pain in his back is likely not related to his hernia.  There is a chance that he could still have some pain in his left groin if the pain is not secondary to his hernia.  Given his imaging also demonstrated bilateral inguinal hernias, we will plan to repair both at the time of surgery -The risk and benefits of robotic assisted laparoscopic bilateral inguinal hernia repair with mesh were discussed including but not limited to bleeding, infection, injury to surrounding structures, need for additional procedures, and hernia recurrence.  After careful consideration, Matthew Mcdowell has decided to proceed with surgery. -Patient tentatively scheduled for surgery on 6/20 -Information provided to the patient regarding inguinal hernias -Advised that he   needs to present to the emergency department if he begins to have nausea, vomiting, nonreducible painful bulge in his groin, and obstipation  All questions were answered to the satisfaction of the patient.  Josephyne Tarter, DO [facility] [address] [place], [place] [address] [phone] (office)      

## 2022-09-05 ENCOUNTER — Other Ambulatory Visit (HOSPITAL_COMMUNITY): Payer: Self-pay

## 2022-09-10 ENCOUNTER — Ambulatory Visit: Payer: No Typology Code available for payment source | Admitting: Urology

## 2022-09-18 ENCOUNTER — Telehealth: Payer: Self-pay | Admitting: *Deleted

## 2022-09-18 ENCOUNTER — Other Ambulatory Visit: Payer: Self-pay | Admitting: *Deleted

## 2022-09-18 DIAGNOSIS — K4020 Bilateral inguinal hernia, without obstruction or gangrene, not specified as recurrent: Secondary | ICD-10-CM

## 2022-09-18 NOTE — Telephone Encounter (Signed)
Patient returned call.   States that he is no longer taking Invokana. States that this was replaced with Januvia and Glipizide.   Advised that he does not need to hold Januvia or Glipizide prior to surgery. The Pre-op nurses will advise if he should take the take them the day of surgery.   Verbalized understanding.   Updated medication list.

## 2022-09-18 NOTE — Telephone Encounter (Signed)
Surgical Date: 09/26/2022 Procedure: XI ROBOTIC ASSISTED BILATERAL INGUINAL HERNIA W/ MESH   Received call from patient [phone]~ telephone.   Patient requested clarification on diabetic medications prior to surgery.   Advised to hold Trulicity x8 days. Patient states that he administers Trulicity on Saturday. Last administration 09/14/2022. Advised to hold dose on 09/21/2022.  Advised to hold Invokana x72 hours. Advised to contact PCP to determine if other means for glucose control are required.

## 2022-09-23 NOTE — Patient Instructions (Signed)
Matthew Mcdowell  09/23/2022     @PREFPERIOPPHARMACY @   Your procedure is scheduled on  09/26/2022.   Report to [name] at  0825  A.M.   Call this number if you have problems the morning of surgery:  [phone]  If you experience any cold or flu symptoms such as cough, fever, chills, shortness of breath, etc. between now and your scheduled surgery, please notify us at the above number.   Remember:  Do not eat or drink after midnight.       Your last dose of trulicity should have been on 09/18/2022 or before.        Your last dose of Jardiance should have been on 09/22/2022.      DO NOT take any medications for diabetes the morning of your procedure.    Take these medicines the morning of surgery with A SIP OF WATER                                                 zyrtec.     Do not wear jewelry, make-up or nail polish, including gel polish,  artificial nails, or any other type of covering on natural nails (fingers and  toes).  Do not wear lotions, powders, or perfumes, or deodorant.  Do not shave 48 hours prior to surgery.  Men may shave face and neck.  Do not bring valuables to the hospital.  [facility] is not responsible for any belongings or valuables.  Contacts, dentures or bridgework may not be worn into surgery.  Leave your suitcase in the car.  After surgery it may be brought to your room.  For patients admitted to the hospital, discharge time will be determined by your treatment team.  Patients discharged the day of surgery will not be allowed to drive home and must have someone with them for 24 hours.    Special instructions:   DO NOT smoke tobacco or vape for 24 hours before your procedure.  Please read over the following fact sheets that you were given. Pain Booklet, Coughing and Deep Breathing, Surgical Site Infection Prevention, Anesthesia Post-op Instructions, and Care and Recovery After Surgery       Laparoscopic Inguinal Hernia Repair,  Adult, Care After The following information offers guidance on how to care for yourself after your procedure. Your health care provider may also give you more specific instructions. If you have problems or questions, contact your health care provider. What can I expect after the procedure? After the procedure, it is common to have: Pain. Swelling and bruising around the incision area. Scrotal swelling, in males. Some fluid or blood draining from your incisions. Follow these instructions at home: Medicines Take over-the-counter and prescription medicines only as told by your health care provider. Ask your health care provider if the medicine prescribed to you: Requires you to avoid driving or using machinery. Can cause constipation. You may need to take these actions to prevent or treat constipation: Drink enough fluid to keep your urine pale yellow. Take over-the-counter or prescription medicines. Eat foods that are high in fiber, such as beans, whole grains, and fresh fruits and vegetables. Limit foods that are high in fat and processed sugars, such as fried or sweet foods. Incision care  Follow instructions from your health care provider about how to take  care of your incisions. Make sure you: Wash your hands with soap and water for at least 20 seconds before and after you change your bandage (dressing). If soap and water are not available, use hand sanitizer. Change your dressing as told by your health care provider. Leave stitches (sutures), skin glue, or adhesive strips in place. These skin closures may need to stay in place for 2 weeks or longer. If adhesive strip edges start to loosen and curl up, you may trim the loose edges. Do not remove adhesive strips completely unless your health care provider tells you to do that. Check your incision area every day for signs of infection. Check for: More redness, swelling, or pain. More fluid or blood. Warmth. Pus or a bad smell. Wear  loose, soft clothing while your incisions heal. Managing pain and swelling If directed, put ice on the painful or swollen areas. To do this: Put ice in a plastic bag. Place a towel between your skin and the bag. Leave the ice on for 20 minutes, 2-3 times a day. Remove the ice if your skin turns bright red. This is very important. If you cannot feel pain, heat, or cold, you have a greater risk of damage to the area.  Activity Do not lift anything that is heavier than 10 lb (4.5 kg), or the limit that you are told, until your health care provider says that it is safe. Ask your health care provider what activities are safe for you. A lot of activity during the first week after surgery can increase pain and swelling. For 1 week after your procedure: Avoid activities that take a lot of effort, such as exercise or sports. You may walk and climb stairs as needed for daily activity, but avoid long walks or climbing stairs for exercise. General instructions If you were given a sedative during the procedure, it can affect you for several hours. Do not drive or operate machinery until your health care provider says that it is safe. Do not take baths, swim, or use a hot tub until your health care provider approves. Ask your health care provider if you may take showers. You may only be allowed to take sponge baths. Do not use any products that contain nicotine or tobacco. These products include cigarettes, chewing tobacco, and vaping devices, such as e-cigarettes. If you need help quitting, ask your health care provider. Keep all follow-up visits. This is important. Contact a health care provider if: You have any of these signs of infection: More redness, swelling, or pain around your incisions or your groin area. More fluid or blood coming from an incision. Warmth coming from an incision. Pus or a bad smell coming from an incision. A fever or chills. You have more swelling in your scrotum, if you are  male. You have severe pain and medicines do not help. You have abdominal pain or swelling. You cannot urinate or have a bowel movement. You faint or feel dizzy. You have nausea and vomiting. Get help right away if: You have redness, warmth, or pain in your leg. You have chest pain. You have problems breathing. These symptoms may represent a serious problem that is an emergency. Do not wait to see if the symptoms will go away. Get medical help right away. Call your local emergency services (911 in the [place] Do not drive yourself to the hospital. Summary Pain, swelling, and bruising are common after the procedure. Check your incision area every day for signs of infection, such  as more redness, swelling, or pain. Put ice on painful or swollen areas for 20 minutes, 2-3 times a day. This information is not intended to replace advice given to you by your health care provider. Make sure you discuss any questions you have with your health care provider. Document Revised: 11/23/2019 Document Reviewed: 11/23/2019 Elsevier Patient Education  2024 [employer] General Anesthesia, Adult, Care After The following information offers guidance on how to care for yourself after your procedure. Your health care provider may also give you more specific instructions. If you have problems or questions, contact your health care provider. What can I expect after the procedure? After the procedure, it is common for people to: Have pain or discomfort at the IV site. Have nausea or vomiting. Have a sore throat or hoarseness. Have trouble concentrating. Feel cold or chills. Feel weak, sleepy, or tired (fatigue). Have soreness and body aches. These can affect parts of the body that were not involved in surgery. Follow these instructions at home: For the time period you were told by your health care provider:  Rest. Do not participate in activities where you could fall or become injured. Do not drive or use  machinery. Do not drink alcohol. Do not take sleeping pills or medicines that cause drowsiness. Do not make important decisions or sign legal documents. Do not take care of children on your own. General instructions Drink enough fluid to keep your urine pale yellow. If you have sleep apnea, surgery and certain medicines can increase your risk for breathing problems. Follow instructions from your health care provider about wearing your sleep device: Anytime you are sleeping, including during daytime naps. While taking prescription pain medicines, sleeping medicines, or medicines that make you drowsy. Return to your normal activities as told by your health care provider. Ask your health care provider what activities are safe for you. Take over-the-counter and prescription medicines only as told by your health care provider. Do not use any products that contain nicotine or tobacco. These products include cigarettes, chewing tobacco, and vaping devices, such as e-cigarettes. These can delay incision healing after surgery. If you need help quitting, ask your health care provider. Contact a health care provider if: You have nausea or vomiting that does not get better with medicine. You vomit every time you eat or drink. You have pain that does not get better with medicine. You cannot urinate or have bloody urine. You develop a skin rash. You have a fever. Get help right away if: You have trouble breathing. You have chest pain. You vomit blood. These symptoms may be an emergency. Get help right away. Call 911. Do not wait to see if the symptoms will go away. Do not drive yourself to the hospital. Summary After the procedure, it is common to have a sore throat, hoarseness, nausea, vomiting, or to feel weak, sleepy, or fatigue. For the time period you were told by your health care provider, do not drive or use machinery. Get help right away if you have difficulty breathing, have chest pain, or  vomit blood. These symptoms may be an emergency. This information is not intended to replace advice given to you by your health care provider. Make sure you discuss any questions you have with your health care provider. Document Revised: 06/22/2021 Document Reviewed: 06/22/2021 Elsevier Patient Education  2024 [employer]. How to Use Chlorhexidine Before Surgery Chlorhexidine gluconate (CHG) is a germ-killing (antiseptic) solution that is used to clean the skin. It can get rid  of the bacteria that normally live on the skin and can keep them away for about 24 hours. To clean your skin with CHG, you may be given: A CHG solution to use in the shower or as part of a sponge bath. A prepackaged cloth that contains CHG. Cleaning your skin with CHG may help lower the risk for infection: While you are staying in the intensive care unit of the hospital. If you have a vascular access, such as a central line, to provide short-term or long-term access to your veins. If you have a catheter to drain urine from your bladder. If you are on a ventilator. A ventilator is a machine that helps you breathe by moving air in and out of your lungs. After surgery. What are the risks? Risks of using CHG include: A skin reaction. Hearing loss, if CHG gets in your ears and you have a perforated eardrum. Eye injury, if CHG gets in your eyes and is not rinsed out. The CHG product catching fire. Make sure that you avoid smoking and flames after applying CHG to your skin. Do not use CHG: If you have a chlorhexidine allergy or have previously reacted to chlorhexidine. On babies younger than 50 months of age. How to use CHG solution Use CHG only as told by your health care provider, and follow the instructions on the label. Use the full amount of CHG as directed. Usually, this is one bottle. During a shower Follow these steps when using CHG solution during a shower (unless your health care provider gives you different  instructions): Start the shower. Use your normal soap and shampoo to wash your face and hair. Turn off the shower or move out of the shower stream. Pour the CHG onto a clean washcloth. Do not use any type of brush or rough-edged sponge. Starting at your neck, lather your body down to your toes. Make sure you follow these instructions: If you will be having surgery, pay special attention to the part of your body where you will be having surgery. Scrub this area for at least 1 minute. Do not use CHG on your head or face. If the solution gets into your ears or eyes, rinse them well with water. Avoid your genital area. Avoid any areas of skin that have broken skin, cuts, or scrapes. Scrub your back and under your arms. Make sure to wash skin folds. Let the lather sit on your skin for 1-2 minutes or as long as told by your health care provider. Thoroughly rinse your entire body in the shower. Make sure that all body creases and crevices are rinsed well. Dry off with a clean towel. Do not put any substances on your body afterward--such as powder, lotion, or perfume--unless you are told to do so by your health care provider. Only use lotions that are recommended by the manufacturer. Put on clean clothes or pajamas. If it is the night before your surgery, sleep in clean sheets.  During a sponge bath Follow these steps when using CHG solution during a sponge bath (unless your health care provider gives you different instructions): Use your normal soap and shampoo to wash your face and hair. Pour the CHG onto a clean washcloth. Starting at your neck, lather your body down to your toes. Make sure you follow these instructions: If you will be having surgery, pay special attention to the part of your body where you will be having surgery. Scrub this area for at least 1 minute. Do not  use CHG on your head or face. If the solution gets into your ears or eyes, rinse them well with water. Avoid your genital  area. Avoid any areas of skin that have broken skin, cuts, or scrapes. Scrub your back and under your arms. Make sure to wash skin folds. Let the lather sit on your skin for 1-2 minutes or as long as told by your health care provider. Using a different clean, wet washcloth, thoroughly rinse your entire body. Make sure that all body creases and crevices are rinsed well. Dry off with a clean towel. Do not put any substances on your body afterward--such as powder, lotion, or perfume--unless you are told to do so by your health care provider. Only use lotions that are recommended by the manufacturer. Put on clean clothes or pajamas. If it is the night before your surgery, sleep in clean sheets. How to use CHG prepackaged cloths Only use CHG cloths as told by your health care provider, and follow the instructions on the label. Use the CHG cloth on clean, dry skin. Do not use the CHG cloth on your head or face unless your health care provider tells you to. When washing with the CHG cloth: Avoid your genital area. Avoid any areas of skin that have broken skin, cuts, or scrapes. Before surgery Follow these steps when using a CHG cloth to clean before surgery (unless your health care provider gives you different instructions): Using the CHG cloth, vigorously scrub the part of your body where you will be having surgery. Scrub using a back-and-forth motion for 3 minutes. The area on your body should be completely wet with CHG when you are done scrubbing. Do not rinse. Discard the cloth and let the area air-dry. Do not put any substances on the area afterward, such as powder, lotion, or perfume. Put on clean clothes or pajamas. If it is the night before your surgery, sleep in clean sheets.  For general bathing Follow these steps when using CHG cloths for general bathing (unless your health care provider gives you different instructions). Use a separate CHG cloth for each area of your body. Make sure you  wash between any folds of skin and between your fingers and toes. Wash your body in the following order, switching to a new cloth after each step: The front of your neck, shoulders, and chest. Both of your arms, under your arms, and your hands. Your stomach and groin area, avoiding the genitals. Your right leg and foot. Your left leg and foot. The back of your neck, your back, and your buttocks. Do not rinse. Discard the cloth and let the area air-dry. Do not put any substances on your body afterward--such as powder, lotion, or perfume--unless you are told to do so by your health care provider. Only use lotions that are recommended by the manufacturer. Put on clean clothes or pajamas. Contact a health care provider if: Your skin gets irritated after scrubbing. You have questions about using your solution or cloth. You swallow any chlorhexidine. Call your local poison control center ([phone] in the [place] Get help right away if: Your eyes itch badly, or they become very red or swollen. Your skin itches badly and is red or swollen. Your hearing changes. You have trouble seeing. You have swelling or tingling in your mouth or throat. You have trouble breathing. These symptoms may represent a serious problem that is an emergency. Do not wait to see if the symptoms will go away. Get medical help  right away. Call your local emergency services (911 in the [place] Do not drive yourself to the hospital. Summary Chlorhexidine gluconate (CHG) is a germ-killing (antiseptic) solution that is used to clean the skin. Cleaning your skin with CHG may help to lower your risk for infection. You may be given CHG to use for bathing. It may be in a bottle or in a prepackaged cloth to use on your skin. Carefully follow your health care provider's instructions and the instructions on the product label. Do not use CHG if you have a chlorhexidine allergy. Contact your health care provider if your skin gets  irritated after scrubbing. This information is not intended to replace advice given to you by your health care provider. Make sure you discuss any questions you have with your health care provider. Document Revised: 07/23/2021 Document Reviewed: 06/05/2020 Elsevier Patient Education  2023 [employer].

## 2022-09-24 ENCOUNTER — Encounter (HOSPITAL_COMMUNITY): Payer: Self-pay

## 2022-09-24 ENCOUNTER — Encounter (HOSPITAL_COMMUNITY)
Admission: RE | Admit: 2022-09-24 | Discharge: 2022-09-24 | Disposition: A | Discharge status: Home / Self Care | Payer: No Typology Code available for payment source | Source: Ambulatory Visit | Attending: Surgery | Admitting: Surgery

## 2022-09-24 VITALS — BP 114/79 | HR 87 | Temp 98.0°F | Resp 18 | Ht 67.0 in | Wt 187.0 lb

## 2022-09-24 DIAGNOSIS — Z01818 Encounter for other preprocedural examination: Secondary | ICD-10-CM | POA: Diagnosis present

## 2022-09-24 DIAGNOSIS — E119 Type 2 diabetes mellitus without complications: Secondary | ICD-10-CM | POA: Diagnosis not present

## 2022-09-24 HISTORY — DX: Gastro-esophageal reflux disease without esophagitis: K21.9

## 2022-09-24 LAB — BASIC METABOLIC PANEL
Anion gap: 12 (ref 5–15)
BUN: 16 mg/dL (ref 6–20)
CO2: 21 mmol/L — ABNORMAL LOW (ref 22–32)
Calcium: 8.6 mg/dL — ABNORMAL LOW (ref 8.9–10.3)
Chloride: 104 mmol/L (ref 98–111)
Creatinine, Ser: 0.72 mg/dL (ref 0.61–1.24)
GFR, Estimated: >60 mL/min (ref >60)
Glucose, Bld: 234 mg/dL — ABNORMAL HIGH (ref 70–99)
Potassium: 3.4 mmol/L — ABNORMAL LOW (ref 3.5–5.1)
Sodium: 137 mmol/L (ref 135–145)

## 2022-09-24 LAB — HEMOGLOBIN A1C
Hgb A1c MFr Bld: 6 % — ABNORMAL HIGH (ref 4.8–5.6)
Mean Plasma Glucose: 125.5 mg/dL

## 2022-09-26 ENCOUNTER — Other Ambulatory Visit: Payer: Self-pay

## 2022-09-26 ENCOUNTER — Ambulatory Visit (HOSPITAL_COMMUNITY)
Admission: RE | Admit: 2022-09-26 | Discharge: 2022-09-26 | Disposition: A | Discharge status: Home / Self Care | Payer: No Typology Code available for payment source | Attending: Surgery | Admitting: Surgery

## 2022-09-26 ENCOUNTER — Ambulatory Visit (HOSPITAL_BASED_OUTPATIENT_CLINIC_OR_DEPARTMENT_OTHER): Payer: No Typology Code available for payment source | Admitting: Anesthesiology

## 2022-09-26 ENCOUNTER — Ambulatory Visit (HOSPITAL_COMMUNITY): Payer: No Typology Code available for payment source | Admitting: Anesthesiology

## 2022-09-26 ENCOUNTER — Encounter (HOSPITAL_COMMUNITY)
Admission: RE | Disposition: A | Discharge status: Home / Self Care | Payer: Self-pay | Source: Home / Self Care | Attending: Surgery

## 2022-09-26 DIAGNOSIS — Z7984 Long term (current) use of oral hypoglycemic drugs: Secondary | ICD-10-CM | POA: Insufficient documentation

## 2022-09-26 DIAGNOSIS — Z87891 Personal history of nicotine dependence: Secondary | ICD-10-CM

## 2022-09-26 DIAGNOSIS — K5730 Diverticulosis of large intestine without perforation or abscess without bleeding: Secondary | ICD-10-CM | POA: Diagnosis not present

## 2022-09-26 DIAGNOSIS — I10 Essential (primary) hypertension: Secondary | ICD-10-CM | POA: Insufficient documentation

## 2022-09-26 DIAGNOSIS — E7800 Pure hypercholesterolemia, unspecified: Secondary | ICD-10-CM | POA: Insufficient documentation

## 2022-09-26 DIAGNOSIS — E119 Type 2 diabetes mellitus without complications: Secondary | ICD-10-CM

## 2022-09-26 DIAGNOSIS — K219 Gastro-esophageal reflux disease without esophagitis: Secondary | ICD-10-CM | POA: Diagnosis not present

## 2022-09-26 DIAGNOSIS — G4730 Sleep apnea, unspecified: Secondary | ICD-10-CM | POA: Insufficient documentation

## 2022-09-26 DIAGNOSIS — I700 Atherosclerosis of aorta: Secondary | ICD-10-CM | POA: Diagnosis not present

## 2022-09-26 DIAGNOSIS — Z79899 Other long term (current) drug therapy: Secondary | ICD-10-CM | POA: Insufficient documentation

## 2022-09-26 DIAGNOSIS — K4020 Bilateral inguinal hernia, without obstruction or gangrene, not specified as recurrent: Secondary | ICD-10-CM | POA: Diagnosis present

## 2022-09-26 LAB — GLUCOSE, CAPILLARY
Glucose-Capillary: 141 mg/dL — ABNORMAL HIGH (ref 70–99)
Glucose-Capillary: 177 mg/dL — ABNORMAL HIGH (ref 70–99)

## 2022-09-26 SURGERY — REPAIR, HERNIA, INGUINAL, BILATERAL, ROBOT-ASSISTED
Anesthesia: General | Site: Abdomen | Laterality: Bilateral

## 2022-09-26 MED ORDER — PROPOFOL 10 MG/ML IV BOLUS
INTRAVENOUS | Status: DC | PRN
  Administered 2022-09-26: 150 mg via INTRAVENOUS

## 2022-09-26 MED ORDER — ROCURONIUM BROMIDE 10 MG/ML (PF) SYRINGE
PREFILLED_SYRINGE | INTRAVENOUS | Status: AC
  Filled 2022-09-26: qty 10

## 2022-09-26 MED ORDER — CHLORHEXIDINE GLUCONATE CLOTH 2 % EX PADS: 6.0000 | MEDICATED_PAD | Freq: Once | CUTANEOUS | Status: DC

## 2022-09-26 MED ORDER — SODIUM CHLORIDE 0.9 % IR SOLN
Status: DC | PRN
  Administered 2022-09-26: 3000 mL

## 2022-09-26 MED ORDER — ACETAMINOPHEN 500 MG PO TABS: 1000.0000 mg | ORAL_TABLET | Freq: Four times a day (QID) | ORAL | 0 refills | Status: AC

## 2022-09-26 MED ORDER — ASPIRIN EC 81 MG PO TBEC: 81.0000 mg | DELAYED_RELEASE_TABLET | Freq: Every day | ORAL | 11 refills | Status: AC

## 2022-09-26 MED ORDER — CEFAZOLIN SODIUM-DEXTROSE 2-4 GM/100ML-% IV SOLN
INTRAVENOUS | Status: AC
  Filled 2022-09-26: qty 100

## 2022-09-26 MED ORDER — ROCURONIUM BROMIDE 10 MG/ML (PF) SYRINGE
PREFILLED_SYRINGE | INTRAVENOUS | Status: DC | PRN
  Administered 2022-09-26: 100 mg via INTRAVENOUS

## 2022-09-26 MED ORDER — GLYCOPYRROLATE PF 0.2 MG/ML IJ SOSY
PREFILLED_SYRINGE | INTRAMUSCULAR | Status: DC | PRN
  Administered 2022-09-26: .2 mg via INTRAVENOUS

## 2022-09-26 MED ORDER — CEFAZOLIN SODIUM-DEXTROSE 2-4 GM/100ML-% IV SOLN
2.0000 g | INTRAVENOUS | Status: AC
  Administered 2022-09-26: 2 g via INTRAVENOUS

## 2022-09-26 MED ORDER — FENTANYL CITRATE (PF) 250 MCG/5ML IJ SOLN
INTRAMUSCULAR | Status: AC
  Filled 2022-09-26: qty 5

## 2022-09-26 MED ORDER — HYDROMORPHONE HCL 1 MG/ML IJ SOLN: 0.2500 mg | INTRAMUSCULAR | Status: DC | PRN

## 2022-09-26 MED ORDER — BUPIVACAINE HCL (PF) 0.5 % IJ SOLN
INTRAMUSCULAR | Status: AC
  Filled 2022-09-26: qty 30

## 2022-09-26 MED ORDER — ORAL CARE MOUTH RINSE: 15.0000 mL | Freq: Once | OROMUCOSAL | Status: DC

## 2022-09-26 MED ORDER — LABETALOL HCL 5 MG/ML IV SOLN
INTRAVENOUS | Status: AC
  Filled 2022-09-26: qty 4

## 2022-09-26 MED ORDER — STERILE WATER FOR IRRIGATION IR SOLN
Status: DC | PRN
  Administered 2022-09-26: 500 mL

## 2022-09-26 MED ORDER — MIDAZOLAM HCL 2 MG/2ML IJ SOLN
INTRAMUSCULAR | Status: AC
  Filled 2022-09-26: qty 2

## 2022-09-26 MED ORDER — DEXAMETHASONE SODIUM PHOSPHATE 10 MG/ML IJ SOLN
INTRAMUSCULAR | Status: AC
  Filled 2022-09-26: qty 1

## 2022-09-26 MED ORDER — ONDANSETRON HCL 4 MG/2ML IJ SOLN
INTRAMUSCULAR | Status: AC
  Filled 2022-09-26: qty 2

## 2022-09-26 MED ORDER — BUPIVACAINE HCL (PF) 0.5 % IJ SOLN
INTRAMUSCULAR | Status: DC | PRN
  Administered 2022-09-26: 30 mL

## 2022-09-26 MED ORDER — MEPERIDINE HCL 50 MG/ML IJ SOLN
6.2500 mg | INTRAMUSCULAR | Status: DC | PRN
  Administered 2022-09-26: 12.5 mg via INTRAVENOUS
  Filled 2022-09-26: qty 1

## 2022-09-26 MED ORDER — ONDANSETRON HCL 4 MG/2ML IJ SOLN: 4.0000 mg | Freq: Once | INTRAMUSCULAR | Status: DC | PRN

## 2022-09-26 MED ORDER — DOCUSATE SODIUM 100 MG PO CAPS: 100.0000 mg | ORAL_CAPSULE | Freq: Two times a day (BID) | ORAL | 2 refills | Status: DC

## 2022-09-26 MED ORDER — PROPOFOL 10 MG/ML IV BOLUS
INTRAVENOUS | Status: AC
  Filled 2022-09-26: qty 20

## 2022-09-26 MED ORDER — LACTATED RINGERS IV SOLN: INTRAVENOUS | Status: DC

## 2022-09-26 MED ORDER — MIDAZOLAM HCL 2 MG/2ML IJ SOLN
INTRAMUSCULAR | Status: DC | PRN
  Administered 2022-09-26: 2 mg via INTRAVENOUS

## 2022-09-26 MED ORDER — SUGAMMADEX SODIUM 200 MG/2ML IV SOLN
INTRAVENOUS | Status: DC | PRN
  Administered 2022-09-26: 200 mg via INTRAVENOUS

## 2022-09-26 MED ORDER — CHLORHEXIDINE GLUCONATE 0.12 % MT SOLN: 15.0000 mL | Freq: Once | OROMUCOSAL | Status: DC

## 2022-09-26 MED ORDER — GLYCOPYRROLATE PF 0.2 MG/ML IJ SOSY
PREFILLED_SYRINGE | INTRAMUSCULAR | Status: AC
  Filled 2022-09-26: qty 1

## 2022-09-26 MED ORDER — PHENYLEPHRINE 80 MCG/ML (10ML) SYRINGE FOR IV PUSH (FOR BLOOD PRESSURE SUPPORT)
PREFILLED_SYRINGE | INTRAVENOUS | Status: DC | PRN
  Administered 2022-09-26: 80 ug via INTRAVENOUS

## 2022-09-26 MED ORDER — LIDOCAINE 2% (20 MG/ML) 5 ML SYRINGE
INTRAMUSCULAR | Status: DC | PRN
  Administered 2022-09-26: 100 mg via INTRAVENOUS

## 2022-09-26 MED ORDER — FENTANYL CITRATE (PF) 250 MCG/5ML IJ SOLN
INTRAMUSCULAR | Status: DC | PRN
  Administered 2022-09-26: 100 ug via INTRAVENOUS
  Administered 2022-09-26 (×3): 50 ug via INTRAVENOUS

## 2022-09-26 MED ORDER — ONDANSETRON HCL 4 MG/2ML IJ SOLN
INTRAMUSCULAR | Status: DC | PRN
  Administered 2022-09-26: 4 mg via INTRAVENOUS

## 2022-09-26 MED ORDER — LIDOCAINE HCL (PF) 2 % IJ SOLN
INTRAMUSCULAR | Status: AC
  Filled 2022-09-26: qty 5

## 2022-09-26 MED ORDER — PHENYLEPHRINE 80 MCG/ML (10ML) SYRINGE FOR IV PUSH (FOR BLOOD PRESSURE SUPPORT)
PREFILLED_SYRINGE | INTRAVENOUS | Status: AC
  Filled 2022-09-26: qty 10

## 2022-09-26 MED ORDER — OXYCODONE HCL 5 MG PO TABS: 5.0000 mg | ORAL_TABLET | Freq: Four times a day (QID) | ORAL | 0 refills | Status: DC | PRN

## 2022-09-26 MED ORDER — DEXAMETHASONE SODIUM PHOSPHATE 10 MG/ML IJ SOLN
INTRAMUSCULAR | Status: DC | PRN
  Administered 2022-09-26: 10 mg via INTRAVENOUS

## 2022-09-26 MED ORDER — LABETALOL HCL 5 MG/ML IV SOLN
INTRAVENOUS | Status: DC | PRN
  Administered 2022-09-26: 5 mg via INTRAVENOUS

## 2022-09-26 SURGICAL SUPPLY — 55 items
ADH SKN CLS APL DERMABOND .7 (GAUZE/BANDAGES/DRESSINGS) ×1
ADH SKNCLS APL OCTYL .7 VIOL (GAUZE/BANDAGES/DRESSINGS) ×1
APL PRP STRL LF DISP 70% ISPRP (MISCELLANEOUS) ×1
BLADE SURG 15 STRL LF DISP TIS (BLADE) ×1 IMPLANT
BLADE SURG 15 STRL SS (BLADE) ×1
CHLORAPREP W/TINT 26 (MISCELLANEOUS) ×1 IMPLANT
COVER LIGHT HANDLE STERIS (MISCELLANEOUS) ×2 IMPLANT
COVER MAYO STAND XLG (MISCELLANEOUS) ×1 IMPLANT
COVER TIP SHEARS 8 DVNC (MISCELLANEOUS) ×1 IMPLANT
DEFOGGER SCOPE WARMER CLEARIFY (MISCELLANEOUS) IMPLANT
DERMABOND ADVANCED .7 DNX12 (GAUZE/BANDAGES/DRESSINGS) ×1 IMPLANT
DRAPE ARM DVNC X/XI (DISPOSABLE) ×3 IMPLANT
DRAPE COLUMN DVNC XI (DISPOSABLE) ×1 IMPLANT
DRAPE HALF SHEET 70X43 (DRAPES) ×1 IMPLANT
DRIVER NDL MEGA SUTCUT DVNCXI (INSTRUMENTS) ×1 IMPLANT
DRIVER NDLE MEGA SUTCUT DVNCXI (INSTRUMENTS) ×1 IMPLANT
ELECT REM PT RETURN 9FT ADLT (ELECTROSURGICAL) ×1
ELECTRODE REM PT RTRN 9FT ADLT (ELECTROSURGICAL) ×1 IMPLANT
FORCEPS BPLR R/ABLATION 8 DVNC (INSTRUMENTS) ×1 IMPLANT
GAUZE SPONGE 4X4 12PLY STRL (GAUZE/BANDAGES/DRESSINGS) ×1 IMPLANT
GLOVE BIO SURGEON STRL SZ7 (GLOVE) IMPLANT
GLOVE BIOGEL PI IND STRL 6.5 (GLOVE) ×2 IMPLANT
GLOVE BIOGEL PI IND STRL 7.0 (GLOVE) ×4 IMPLANT
GLOVE SURG SS PI 6.5 STRL IVOR (GLOVE) ×2 IMPLANT
GOWN STRL REUS W/TWL LRG LVL3 (GOWN DISPOSABLE) ×3 IMPLANT
GRASPER SUT TROCAR 14GX15 (MISCELLANEOUS) IMPLANT
IRRIGATOR SUCT 8 DISP DVNC XI (IRRIGATION / IRRIGATOR) IMPLANT
KIT PINK PAD W/HEAD ARE REST (MISCELLANEOUS) ×1
KIT PINK PAD W/HEAD ARM REST (MISCELLANEOUS) ×1 IMPLANT
KIT TURNOVER KIT A (KITS) ×1 IMPLANT
MANIFOLD NEPTUNE II (INSTRUMENTS) ×1 IMPLANT
MESH PROGRIP LAP SELF FIXATING (Mesh General) ×2 IMPLANT
MESH PROGRIP LAP SLF FIX 16X12 (Mesh General) ×1 IMPLANT
NDL HYPO 21X1 ECLIPSE (NEEDLE) ×1 IMPLANT
NDL INSUFFLATION 14GA 120MM (NEEDLE) ×1 IMPLANT
NEEDLE HYPO 21X1 ECLIPSE (NEEDLE) ×1 IMPLANT
NEEDLE INSUFFLATION 14GA 120MM (NEEDLE) ×1 IMPLANT
OBTURATOR OPTICAL STND 8 DVNC (TROCAR) ×1
OBTURATOR OPTICALSTD 8 DVNC (TROCAR) ×1 IMPLANT
PACK LAP CHOLE LZT030E (CUSTOM PROCEDURE TRAY) ×1 IMPLANT
PENCIL HANDSWITCHING (ELECTRODE) ×1 IMPLANT
SCISSORS MNPLR CVD DVNC XI (INSTRUMENTS) ×1 IMPLANT
SEAL CANN UNIV 5-8 DVNC XI (MISCELLANEOUS) ×3 IMPLANT
SET BASIN LINEN APH (SET/KITS/TRAYS/PACK) ×1 IMPLANT
SET TUBE SMOKE EVAC HIGH FLOW (TUBING) ×1 IMPLANT
SOL PREP POV-IOD 4OZ 10% (MISCELLANEOUS) ×1 IMPLANT
SUT MNCRL AB 4-0 PS2 18 (SUTURE) ×1 IMPLANT
SUT V-LOC 90 ABS 3-0 VLT V-20 (SUTURE) IMPLANT
SUT VIC AB 3-0 SH 27 (SUTURE) ×1
SUT VIC AB 3-0 SH 27X BRD (SUTURE) IMPLANT
SYR 30ML LL (SYRINGE) ×1 IMPLANT
TAPE TRANSPORE STRL 2 31045 (GAUZE/BANDAGES/DRESSINGS) ×1 IMPLANT
TRAY FOL W/BAG SLVR 16FR STRL (SET/KITS/TRAYS/PACK) ×1 IMPLANT
TRAY FOLEY W/BAG SLVR 16FR LF (SET/KITS/TRAYS/PACK) ×1
WATER STERILE IRR 500ML POUR (IV SOLUTION) ×1 IMPLANT

## 2022-09-26 NOTE — Interval H&P Note (Signed)
History and Physical Interval Note:  09/26/2022 9:19 AM  Matthew Mcdowell  has presented today for surgery, with the diagnosis of BILATERAL INGUINAL HERNIA.  The various methods of treatment have been discussed with the patient and family. After consideration of risks, benefits and other options for treatment, the patient has consented to  Procedure(s): XI ROBOTIC ASSISTED BILATERAL INGUINAL HERNIA W/ MESH (N/A) as a surgical intervention.  The patient's history has been reviewed, patient examined, no change in status, stable for surgery.  I have reviewed the patient's chart and labs.  Questions were answered to the patient's satisfaction.     Cinda Hara A Jaymien Landin

## 2022-09-26 NOTE — Progress Notes (Signed)
[facility]  Spoke with the patient's wife in the consultation room.  I explained that he tolerated the procedure without difficulty.  He has dissolvable stitches under the skin with overlying skin glue.  This will flake off in 10 to 14 days.  I discharged him home with a prescription for narcotic pain medication that they should take as needed for pain.  I also want him taking scheduled Tylenol.  If they take the narcotic pain medication, they should take a stool softener as well.  The patient will follow-up with me in 2 weeks.  All questions were answered to her expressed satisfaction.  Caroleen Stoermer, DO [facility] [address] [place], [place] [address] [phone] (office)  

## 2022-09-26 NOTE — Discharge Instructions (Signed)
Ambulatory Surgery Discharge Instructions  General Anesthesia or Sedation Do not drive or operate heavy machinery for 24 hours.  Do not consume alcohol, tranquilizers, sleeping medications, or any non-prescribed medications for 24 hours. Do not make important decisions or sign any important papers in the next 24 hours. You should have someone with you tonight at home.  Activity  You are advised to go directly home from the hospital.  Restrict your activities and rest for a day.  Resume light activity tomorrow. No heavy lifting over 10 lbs or strenuous exercise.  Fluids and Diet Begin with clear liquids, bouillon, dry toast, soda crackers.  If not nauseated, you may go to a regular diet when you desire.  Greasy and spicy foods are not advised.  Medications  If you have not had a bowel movement in 24 hours, take 2 tablespoons over the counter Milk of mag.             You May resume your blood thinners tomorrow (Aspirin, coumadin, or other).  You are being discharged with prescriptions for Opioid/Narcotic Medications: There are some specific considerations for these medications that you should know. Opioid Meds have risks & benefits. Addiction to these meds is always a concern with prolonged use Take medication only as directed Do not drive while taking narcotic pain medication Do not crush tablets or capsules Do not use a different container than medication was dispensed in Lock the container of medication in a cool, dry place out of reach of children and pets. Opioid medication can cause addiction Do not share with anyone else (this is a felony) Do not store medications for future use. Dispose of them properly.     Disposal:  Find a [facility] household drug take back site near you.  If you can't get to a drug take back site, use the recipe below as a last resort to dispose of expired, unused or unwanted drugs. Disposal  (Do not dispose chemotherapy drugs this way, talk to your  prescribing doctor instead.) Step 1: Mix drugs (do not crush) with dirt, kitty litter, or used coffee grounds and add a small amount of water to dissolve any solid medications. Step 2: Seal drugs in plastic bag. Step 3: Place plastic bag in trash. Step 4: Take prescription container and scratch out personal information, then recycle or throw away.  Operative Site  You have a liquid bandage over your incisions, this will begin to flake off in about a week. Ok to shower tomorrow. Keep wound clean and dry. No baths or swimming. No lifting more than 10 pounds.  Contact Information: If you have questions or concerns, please call our office, [phone], Monday- Thursday 8AM-5PM and Friday 8AM-12Noon.  If it is after hours or on the weekend, please call [facility] Main Number, [phone], and ask to speak to the surgeon on call for Dr. Mabell Esguerra at [place].   SPECIFIC COMPLICATIONS TO WATCH FOR: Inability to urinate Fever over 101? F by mouth Nausea and vomiting lasting longer than 24 hours. Pain not relieved by medication ordered Swelling around the operative site Increased redness, warmth, hardness, around operative area Numbness, tingling, or cold fingers or toes Blood -soaked dressing, (small amounts of oozing may be normal) Increasing and progressive drainage from surgical area or exam site  

## 2022-09-26 NOTE — Op Note (Signed)
[facility] Operative Note  09/26/22  Preoperative Diagnosis: bilateral inguinal Hernias   Postoperative Diagnosis: Same   Procedure(s) Performed: Robotic assisted laparoscopic bilateral inguinal hernia repair with mesh   Surgeon: Theophilus Kinds, DO   Assistants: No qualified resident was available    Anesthesia: General endotracheal   Anesthesiologist: Dr. Alva Garnet   Specimens: None   Estimated Blood Loss: Minimal   Blood Replacement: None    Complications: None   Wound Class:Clean   Operative Indications: The patient has bilateral inguinal hernias that are symptomatic, and he wants them repaired. We discussed robotic assisted laparoscopic inguinal hernia repair and risk of bleeding, infection, issues with chronic pain post operatively, use of mesh, risk of recurrence, chance of needing to repair a bilateral hernia, risk of injury to bowel or bladder, and risk of injury to cord structures for male patients.   Findings: Vas Deferens and cord structures identified and preserved bilaterally Progrip laparoscopic mesh placed, bilaterally Hemostasis achieved   Procedure: The patient was taken to the operating room and placed supine. General endotracheal anesthesia was induced. Intravenous antibiotics were administered per protocol.  A foley catheter was placed and a orogastric tube positioned to decompress the stomach. The abdomen was prepared and draped in the usual sterile fashion.  A time-out was completed verifying correct patient, procedure, site, positioning, and implant(s) and/or special equipment prior to beginning this procedure.  An incision was marked 20 cm above the pubic tubercle, slightly above the umbilicus. Veress needle inserted at palmer's point.  Saline drop test noted to be positive with gradual increase in pressure after initiation of gas insufflation.  15 mm of pressure was achieved prior to removing the Veress needle and then placing a 8  mm port via the Optiview technique through the supraumbilical site that had been previously marked.  Inspection of the area afterwards noted no injury to the surrounding organs during insertion of the needle and the port.  2 port sites were marked 8 cm to the lateral sides of the initial port, and a 8 mm robotic port was placed on the left side, another 8 mm robotic port on the right side under direct supervision. The [employer] platform was then brought into the operative field and docked to the ports.  Examination of the abdominal cavity noted a small bilateral inguinal hernias.  A peritoneal flap was created first on the left side approximately 8 cm cephalad to the defect by using scissors with electrocautery.  Dissection was carried down towards the pubic tubercle, developing the myopectineal orifice view.  Laterally the flap was carried towards the ASIS.  An indirect hernia sac was noted, which carefully dissected away from the adjacent tissues to be fully reduced out of hernia cavity.  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 Any bleeding was controlled with combination of electrocautery and manual pressure.  Attention was then turned to the right side. A peritoneal flap was created first on the right side approximately 8 cm cephalad to the defect by using scissors with electrocautery.  Dissection was carried down towards the pubic tubercle, developing the myopectineal orifice view.  Laterally the flap was carried towards the ASIS.  An indirect hernia sac was noted, which carefully dissected away from the adjacent tissues to be fully reduced out of hernia cavity.  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 Any bleeding was controlled with combination of electrocautery and manual pressure.  After confirming adequate dissection and the peritoneal reflection completely down and away from the cord structures the divided round  ligament at the deep ring, a Progrip laparoscopic mesh was placed within the anterior abdominal wall on both sides.  After noting proper placement of the mesh with the peritoneal reflection deep to it, the previously created peritoneal flap was secured back up to the anterior abdominal wall using running 3-0 V-Lock. Any holes created in the peritoneal flap were closed with 3-0 Vicryl. All needles were then removed out of the abdominal cavity, Xi platform undocked from the ports and removed off of operative field.  Marcaine was infused at the port sites.   The abdomen was then desufflated and ports removed. All skin incisions were closed with a subcuticular stitch of Monocryl 4-0. Dermabond was applied. The testis was gently pulled down into its anatomic position in the scrotum.  Final inspection revealed acceptable hemostasis. All counts were correct at the end of the case. The patient was awakened from anesthesia and extubated without complication.  The patient went to the PACU in stable condition.   Theophilus Kinds, DO [facility] [address] [place] [place], [place] [address] [phone] (office)

## 2022-09-26 NOTE — Anesthesia Procedure Notes (Signed)
Procedure Name: Intubation Date/Time: 09/26/2022 10:00 AM  Performed by: Izola Price., CRNAPre-anesthesia Checklist: Patient identified, Emergency Drugs available, Suction available and Patient being monitored Patient Re-evaluated:Patient Re-evaluated prior to induction Oxygen Delivery Method: Circle system utilized Preoxygenation: Pre-oxygenation with 100% oxygen Induction Type: IV induction Ventilation: Mask ventilation without difficulty Laryngoscope Size: Mac and 4 Grade View: Grade I Tube type: Oral Tube size: 7.5 mm Number of attempts: 1 Airway Equipment and Method: Stylet and Oral airway Placement Confirmation: ETT inserted through vocal cords under direct vision, positive ETCO2 and breath sounds checked- equal and bilateral Secured at: 23 cm Tube secured with: Tape Dental Injury: Teeth and Oropharynx as per pre-operative assessment

## 2022-09-26 NOTE — Anesthesia Postprocedure Evaluation (Signed)
Anesthesia Post Note  Patient: Garnet Zamorski  Procedure(s) Performed: XI ROBOTIC ASSISTED BILATERAL INGUINAL HERNIA W/ MESH (Abdomen)  Patient location during evaluation: Phase II Anesthesia Type: General Level of consciousness: awake and alert and oriented Pain management: pain level controlled Vital Signs Assessment: post-procedure vital signs reviewed and stable Respiratory status: spontaneous breathing, nonlabored ventilation and respiratory function stable Cardiovascular status: blood pressure returned to baseline and stable Postop Assessment: no apparent nausea or vomiting Anesthetic complications: no  No notable events documented.   Last Vitals:  Vitals:   09/26/22 1330 09/26/22 1353  BP: 129/85 125/79  Pulse: 71 77  Resp: 15 18  Temp:  36.8 C  SpO2: 97% 97%    Last Pain:  Vitals:   09/26/22 1353  TempSrc: Oral  PainSc: 4                  Calleen Alvis C Sagal [place]

## 2022-09-26 NOTE — Transfer of Care (Signed)
Immediate Anesthesia Transfer of Care Note  Patient: Matthew Mcdowell  Procedure(s) Performed: XI ROBOTIC ASSISTED BILATERAL INGUINAL HERNIA W/ MESH (Abdomen)  Patient Location: PACU  Anesthesia Type:General  Level of Consciousness: awake, alert , and oriented  Airway & Oxygen Therapy: Patient Spontanous Breathing and Patient connected to face mask oxygen  Post-op Assessment: Report given to RN and Post -op Vital signs reviewed and stable  Post vital signs: Reviewed and stable  Last Vitals:  Vitals Value Taken Time  BP 128/87 09/26/22 1239  Temp    Pulse 72 09/26/22 1241  Resp 19 09/26/22 1241  SpO2 98 % 09/26/22 1241  Vitals shown include unvalidated device data.  Last Pain:  Vitals:   09/26/22 0848  TempSrc: Oral  PainSc: 5       Patients Stated Pain Goal: 4 (09/26/22 0848)  Complications: No notable events documented.

## 2022-09-26 NOTE — Anesthesia Preprocedure Evaluation (Addendum)
Anesthesia Evaluation  Patient identified by MRN, date of birth, ID band Patient awake    Reviewed: Allergy & Precautions, H&P , NPO status , Patient's Chart, lab work & pertinent test results  Airway Mallampati: II  TM Distance: >3 FB Neck ROM: Full    Dental  (+) Chipped, Dental Advisory Given   Pulmonary sleep apnea (snoring) , former smoker   Pulmonary exam normal breath sounds clear to auscultation       Cardiovascular Exercise Tolerance: Good hypertension, Pt. on medications Normal cardiovascular exam Rhythm:Regular Rate:Normal     Neuro/Psych negative neurological ROS  negative psych ROS   GI/Hepatic Neg liver ROS,GERD  Medicated and Controlled,,  Endo/Other  diabetes, Well Controlled, Type 2, Oral Hypoglycemic Agents    Renal/GU negative Renal ROS  negative genitourinary   Musculoskeletal negative musculoskeletal ROS (+)    Abdominal   Peds negative pediatric ROS (+)  Hematology negative hematology ROS (+)   Anesthesia Other Findings   Reproductive/Obstetrics negative OB ROS                             Anesthesia Physical Anesthesia Plan  ASA: 2  Anesthesia Plan: General   Post-op Pain Management: Dilaudid IV   Induction: Intravenous  PONV Risk Score and Plan: 4 or greater and Ondansetron and Dexamethasone  Airway Management Planned: Oral ETT and Video Laryngoscope Planned  Additional Equipment:   Intra-op Plan:   Post-operative Plan: Extubation in OR  Informed Consent: I have reviewed the patients History and Physical, chart, labs and discussed the procedure including the risks, benefits and alternatives for the proposed anesthesia with the patient or authorized representative who has indicated his/her understanding and acceptance.     Dental advisory given  Plan Discussed with: CRNA and Surgeon  Anesthesia Plan Comments:        Anesthesia Quick  Evaluation

## 2022-09-27 DIAGNOSIS — K4020 Bilateral inguinal hernia, without obstruction or gangrene, not specified as recurrent: Secondary | ICD-10-CM

## 2022-10-07 ENCOUNTER — Other Ambulatory Visit (HOSPITAL_COMMUNITY): Payer: Self-pay

## 2022-10-09 ENCOUNTER — Ambulatory Visit: Payer: No Typology Code available for payment source | Admitting: Surgery

## 2022-10-09 ENCOUNTER — Other Ambulatory Visit: Payer: Self-pay

## 2022-10-09 ENCOUNTER — Encounter: Payer: Self-pay | Admitting: Surgery

## 2022-10-09 VITALS — BP 110/75 | HR 99 | Temp 98.1°F | Resp 18 | Ht 67.0 in | Wt 180.0 lb

## 2022-10-09 DIAGNOSIS — Z09 Encounter for follow-up examination after completed treatment for conditions other than malignant neoplasm: Secondary | ICD-10-CM

## 2022-10-09 NOTE — Progress Notes (Signed)
[facility] Note   HPI:  62 y.o. Male presents to clinic for post-op follow-up s/p robotic assisted bilateral inguinal hernia repair with mesh on 6/20.  He has overall been doing well since the surgery.  He has some left lower abdominal and scrotal tenderness, but it has been continuing to improve since the surgery.  He is tolerating a diet without nausea and vomiting.  He is moving his bowels without issue.  Review of Systems:  All other review of systems: otherwise negative   Vital Signs:  BP 110/75   Pulse 99   Temp 98.1 F (36.7 C) (Oral)   Resp 18   Ht 5\' 7"  (1.702 m)   Wt 180 lb (81.6 kg)   SpO2 95%   BMI 28.19 kg/m    Physical Exam:  Physical Exam Vitals reviewed.  Constitutional:      Appearance: Normal appearance.  Abdominal:     Comments: Abdomen soft, nondistended, no percussion tenderness, mild TTP in LLQ and within the scrotum; no rigidity, guarding or rebound tenderness; incisions healing well with skin glue in place, mild ecchymosis to the scrotum  Neurological:     Mental Status: He is alert.     Laboratory studies: None  Imaging:  None   Assessment:  62 y.o. yo Male who presents for follow-up status post robotic assisted laparoscopic bilateral inguinal hernia repair with mesh on 6/20.  Plan:  -Patient overall doing very well since the surgery.  He is tolerating a diet, moving his bowels, and pain is slowly continuing to improve.  I advised that he is still only 2 weeks out from surgery, so he will still have some intermittent pains especially in the left lower quadrant -I advised that the ecchymosis in his scrotum is also within normal limits.  And this will continue to improve with time -Advised that he should ice his groin in the evenings for the next couple of weeks.  Also use Tylenol as needed for pain -Advised that he can use antibiotic ointment at his incision sites prior to showering to help loosen the remaining skin glue -Follow  up with me as needed  All of the above recommendations were discussed with the patient, and all of patient's  questions were answered to his expressed satisfaction.  Theophilus Kinds, DO [facility] [address] [place] [place], [place] [address] [phone] (office)

## 2022-10-14 IMAGING — CT CT RENAL STONE PROTOCOL
2 of 4 series · 16 of 46 positions shown, 18 images · non-contrast
Comparison: None.

CLINICAL DATA: Flank pain.  Left.  Kidney stone suspected.

EXAM:
CT ABDOMEN AND PELVIS WITHOUT CONTRAST
TECHNIQUE: Multidetector CT imaging of the abdomen and pelvis was performed
following the standard protocol without IV contrast.

[Series 2: axial st · axial · 0.79mm/px · z∈[+1203,+1613]mm · 13 of 94 slices shown, 15 images]
[im 6/94  soft-tissue]
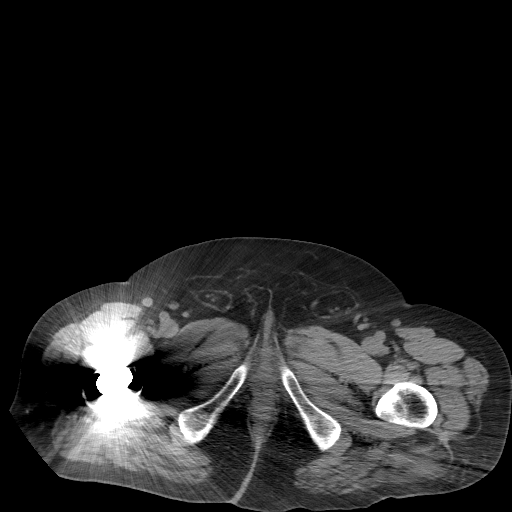
[im 6/94  bone]
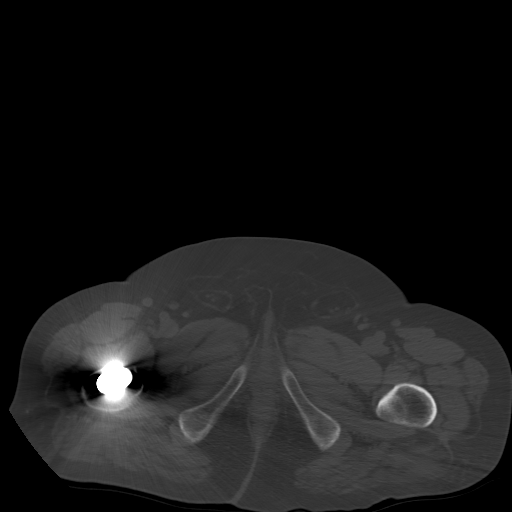
[im 11/94  soft-tissue]
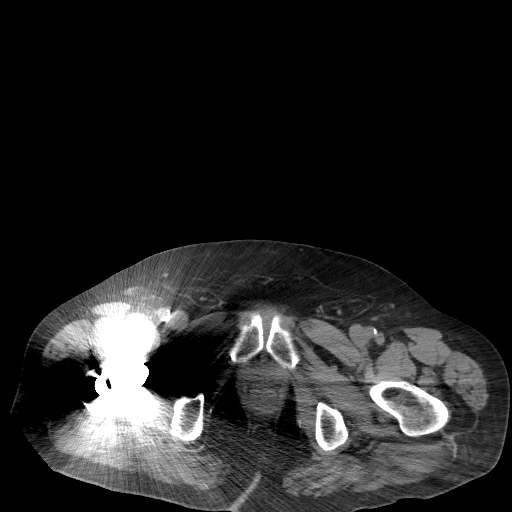
[im 22/94  soft-tissue]
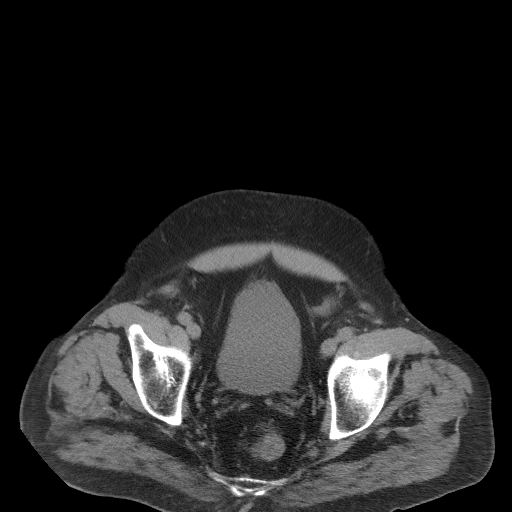
[im 28/94  soft-tissue]
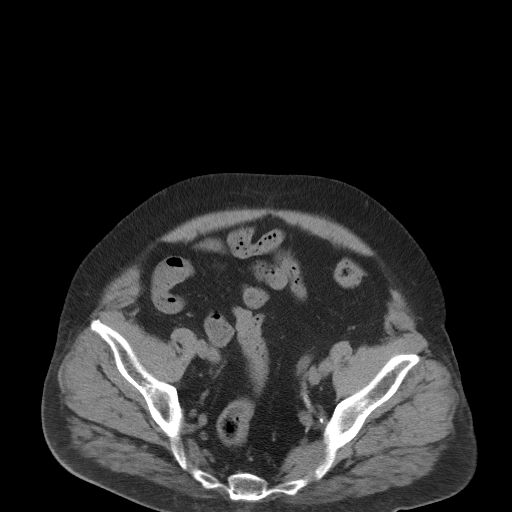
[im 33/94  soft-tissue]
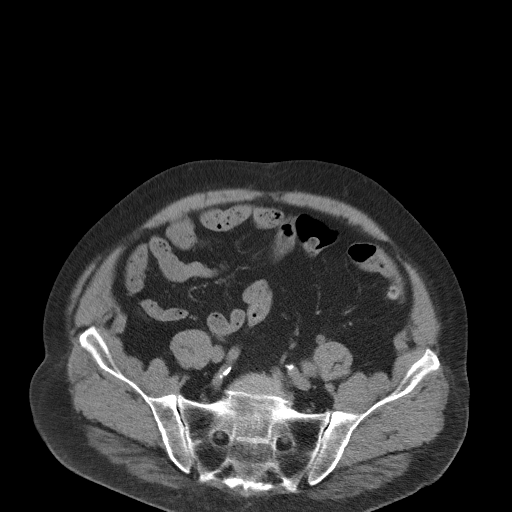
[im 39/94  soft-tissue]
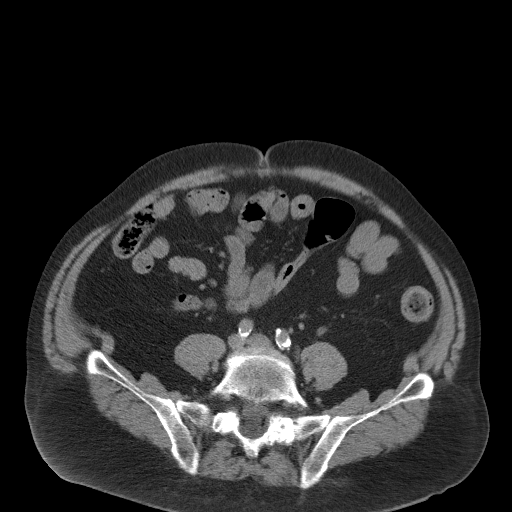
[im 50/94  soft-tissue]
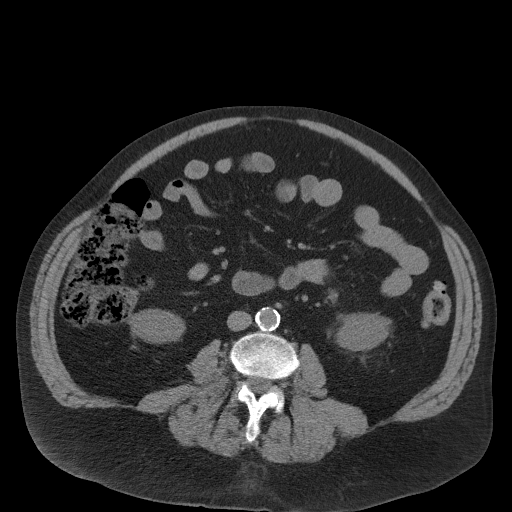
[im 55/94  soft-tissue]
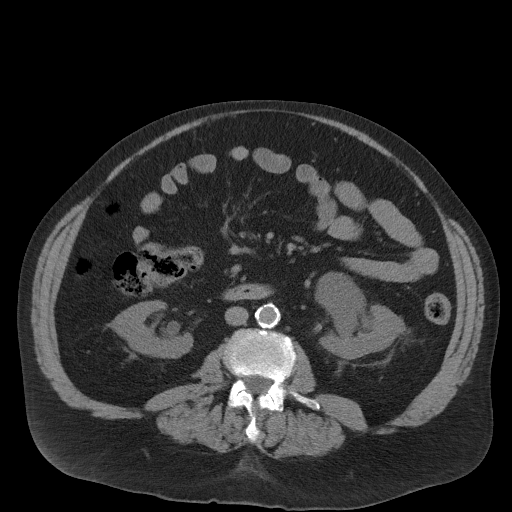
[im 61/94  soft-tissue]
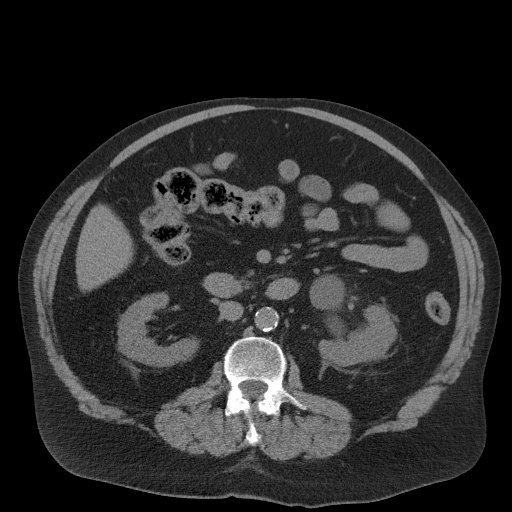
[im 61/94  bone]
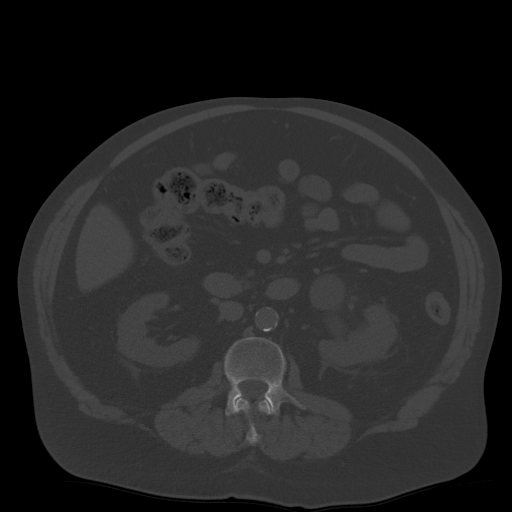
[im 66/94  soft-tissue]
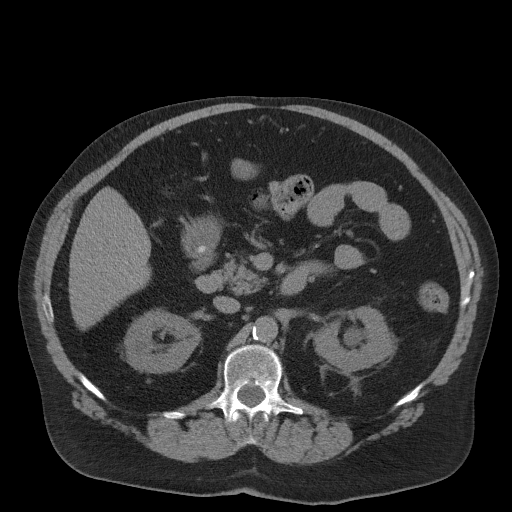
[im 72/94  soft-tissue]
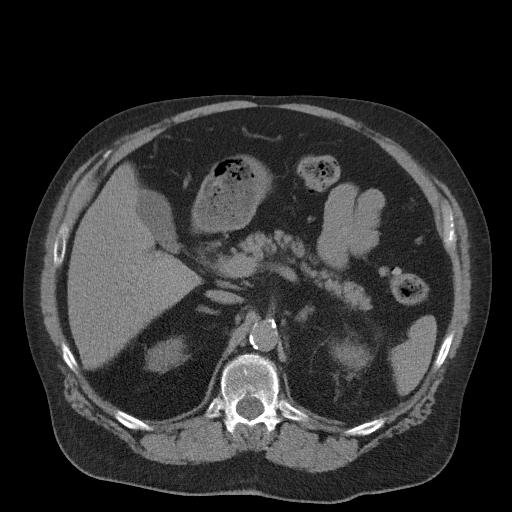
[im 83/94  soft-tissue]
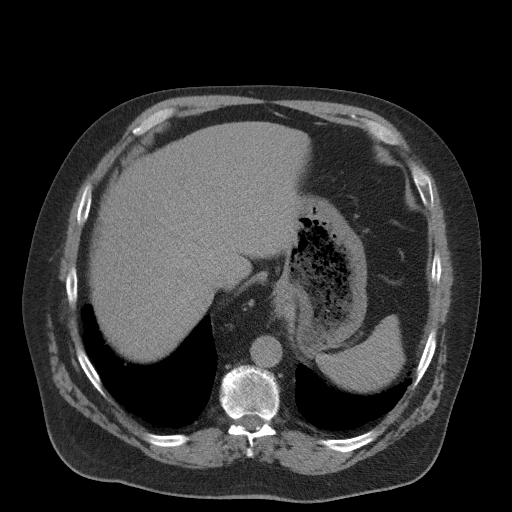
[im 88/94  soft-tissue]
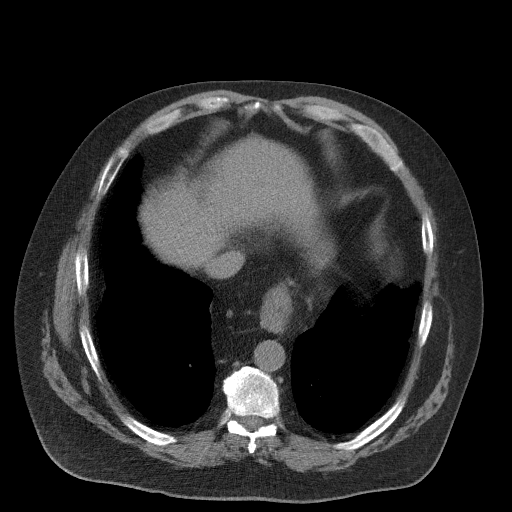

[Series 5: coronal st · coronal · 0.79mm/px · 3 of 105 slices shown]
[im 35/105  soft-tissue]
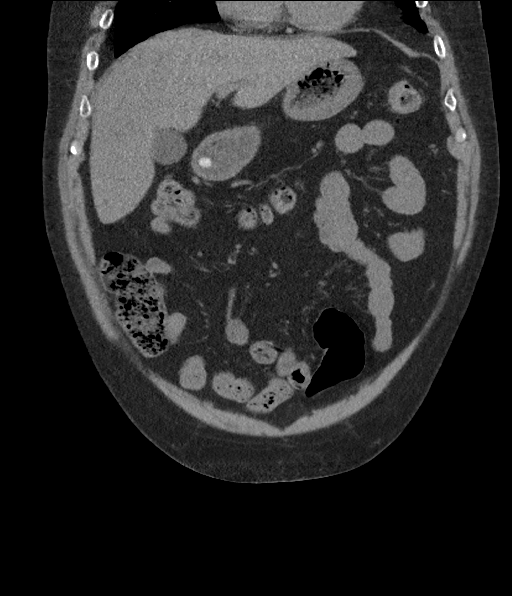
[im 47/105  soft-tissue]
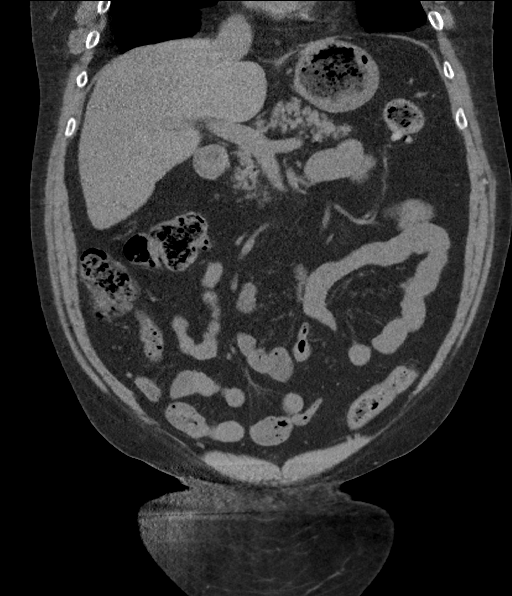
[im 58/105  soft-tissue]
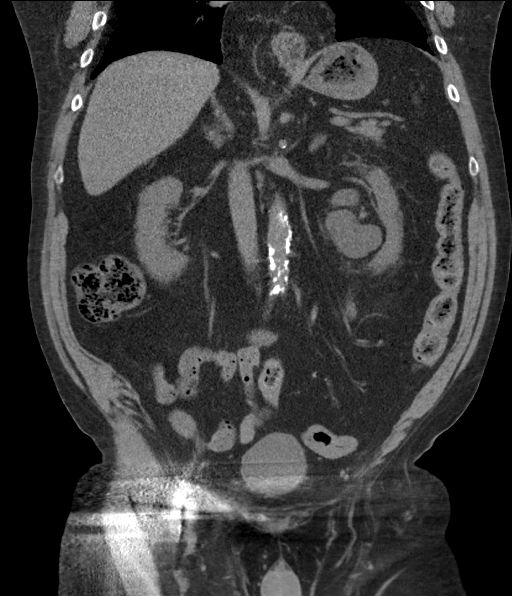

[16 of 46 positions shown; findings below may reference images not displayed]

FINDINGS: Lower chest: Coronary artery calcifications. No acute abnormality.
Small hiatal hernia.

Hepatobiliary: No focal liver abnormality. No gallstones,
gallbladder wall thickening, or pericholecystic fluid. No biliary
dilatation.

Pancreas: No focal lesion. Normal pancreatic contour. No surrounding
inflammatory changes. No main pancreatic ductal dilatation. Hip
Normal in size without focal abnormality.

Spleen: Normal in size without focal abnormality.

Adrenals/Urinary Tract:

No adrenal nodule bilaterally.

Bilateral perinephric stranding nonspecific, left greater than
right. Mild hydroureteronephrosis on the left. Associated distal
ureter 3 mm calcified stone. Couple of 3 mm calcified stones within
the left kidney. Extrarenal left renal pelvis again noted. Couple of
punctate calcified stones within the right kidney. No right
ureterolithiasis. No right hydroureteronephrosis.

Limited evaluation of the urinary bladder due to streak artifact
originating from the femoral surgical hardware in the right. The
urinary bladder is unremarkable.

Stomach/Bowel: Stomach is within normal limits. No evidence of bowel
wall thickening or dilatation. Scattered colonic diverticulosis.
Appendix appears normal.

Vascular/Lymphatic: No abdominal aorta or iliac aneurysm. Severe
atherosclerotic plaque of the aorta and its branches. No abdominal,
pelvic, or inguinal lymphadenopathy.

Reproductive: Not visualized this streak artifact originating from
the lumen of the right hip arthroplasty.

Other: No intraperitoneal free fluid. No intraperitoneal free gas.
No organized fluid collection.

Musculoskeletal:

No abdominal wall hernia or abnormality.

No suspicious lytic or blastic osseous lesions. No acute displaced
fracture. Multilevel degenerative changes of the spine. Partially
visualized total right hip arthroplasty.
IMPRESSION: 1. Obstructive 3 mm distal left ureteral stone. Correlate with
urinalysis for superimposed infection.
2. Nonobstructive bilateral nephrolithiasis (3 mm on the left,
punctate on the right).
3. Scattered colonic diverticulosis with no acute diverticulitis.
4. Small hiatal hernia.

## 2022-10-15 ENCOUNTER — Telehealth: Payer: Self-pay | Admitting: *Deleted

## 2022-10-15 ENCOUNTER — Other Ambulatory Visit (HOSPITAL_COMMUNITY): Payer: Self-pay

## 2022-10-15 MED ORDER — TRULICITY 1.5 MG/0.5ML ~~LOC~~ SOAJ
1.5000 mg | SUBCUTANEOUS | 4 refills | Status: DC
  Filled 2022-10-15: qty 2, 28d supply, fill #0
  Filled 2022-11-11: qty 2, 28d supply, fill #1
  Filled 2022-12-11: qty 2, 28d supply, fill #2
  Filled 2023-01-20: qty 2, 28d supply, fill #3
  Filled 2023-03-13: qty 2, 28d supply, fill #4
  Filled 2023-04-09: qty 2, 28d supply, fill #5
  Filled 2023-05-05: qty 2, 28d supply, fill #6
  Filled 2023-06-13: qty 2, 28d supply, fill #7
  Filled 2023-07-15: qty 2, 28d supply, fill #8
  Filled 2023-08-13: qty 2, 28d supply, fill #9
  Filled 2023-09-06: qty 2, 28d supply, fill #10
  Filled 2023-10-05: qty 2, 28d supply, fill #11

## 2022-10-15 NOTE — Telephone Encounter (Signed)
Surgical Date: 09/26/2022 Procedure: XI ROBOTIC ASSISTED BILATERAL INGUINAL HERNIA W/ MESH   Received call from patient [phone]~ telephone.   Patient inquired as to return to work date. States that he is currently on short term disability that will end on 11/09/2022.  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   Patient states that he will require return to work note for HR. Note transcribed with return to work date of 11/11/2022.

## 2022-10-16 ENCOUNTER — Other Ambulatory Visit (HOSPITAL_COMMUNITY): Payer: Self-pay

## 2022-11-11 ENCOUNTER — Other Ambulatory Visit (HOSPITAL_COMMUNITY): Payer: Self-pay

## 2022-12-11 ENCOUNTER — Other Ambulatory Visit (HOSPITAL_COMMUNITY): Payer: Self-pay

## 2023-01-01 ENCOUNTER — Ambulatory Visit (INDEPENDENT_AMBULATORY_CARE_PROVIDER_SITE_OTHER): Payer: No Typology Code available for payment source | Admitting: Surgery

## 2023-01-01 ENCOUNTER — Encounter: Payer: Self-pay | Admitting: Surgery

## 2023-01-01 DIAGNOSIS — R1909 Other intra-abdominal and pelvic swelling, mass and lump: Secondary | ICD-10-CM | POA: Diagnosis not present

## 2023-01-01 NOTE — Progress Notes (Signed)
[facility] Note   HPI:  62 y.o. Male presents to clinic for evaluation for bilateral groin masses status post robotic assisted laparoscopic bilateral inguinal hernia repair with mesh on 6/20.  Patient stated that he has had a mass present in his left groin since immediately postoperatively.  He did not notice it much until he returned to work in early September.  He is also been having some groin pain along the left groin.  He states that the pain has been improving, and he feels that the groin mass on the left side has also been decreasing in size.  When he started noting the mass earlier this month in his left groin, he also noted a new mass in the right groin.  He thought that both of these masses were related to activity while at work.  He is tolerating a diet without nausea and vomiting, moving his bowels without issue.  He denies taking anything for his pain currently and denies icing his groins recently.  Denies fevers and chills.  Review of Systems:  All other review of systems: otherwise negative   Vital Signs:  BP 106/73   Pulse 96   Temp 98.1 F (36.7 C) (Oral)   Resp 12   Ht 5\' 7"  (1.702 m)   Wt 180 lb (81.6 kg)   SpO2 93%   BMI 28.19 kg/m    Physical Exam:  Physical Exam Vitals reviewed.  Constitutional:      Appearance: Normal appearance.  Abdominal:     Comments: Soft, nondistended, no percussion tenderness, nontender to palpation; no rigidity, guarding, rebound tenderness; bilateral small masses noted along the spermatic cords, nontender to palpation, no bulging with coughing to suggest hernia  Neurological:     Mental Status: He is alert.     Laboratory studies: none   Imaging:  None  Assessment:  62 y.o. yo Male who presents for bilateral groin masses status post robotic assisted laparoscopic bilateral inguinal hernia repair with mesh on 6/20  Plan:  -Given that his pain is improving, the mass on the left side appears to be decreasing in  size, and the areas do not bulge with coughing, I suspect that this is postoperative/inflammatory changes to the spermatic cord versus retained cord lipoma. -Advised the patient that I want him to take scheduled Tylenol for the next few weeks and ice his groin in the evenings after activity.  Since the right mass presented itself after he started increasing his activity, it is possible that this could be inflammatory changes to the spermatic cord from increased activity -Will hold off on any imaging at this point and see how patient does with conservative measures -I will follow-up with the patient in 1 month to see how he is doing and how these areas are progressing  All of the above recommendations were discussed with the patient, and all of patient's questions were answered to his expressed satisfaction.  Theophilus Kinds, DO [facility] [address] [place], [place] [address] [phone] (office)

## 2023-01-01 NOTE — Patient Instructions (Signed)
-  Take Motrin 800 mg 3 times a day for the next 3-4 weeks -Ice both groins in the evenings when you are relaxing for the day

## 2023-01-20 ENCOUNTER — Other Ambulatory Visit (HOSPITAL_COMMUNITY): Payer: Self-pay

## 2023-01-30 ENCOUNTER — Ambulatory Visit (INDEPENDENT_AMBULATORY_CARE_PROVIDER_SITE_OTHER): Payer: No Typology Code available for payment source | Admitting: Surgery

## 2023-01-30 DIAGNOSIS — R1909 Other intra-abdominal and pelvic swelling, mass and lump: Secondary | ICD-10-CM | POA: Diagnosis not present

## 2023-01-31 NOTE — Progress Notes (Signed)
[facility]  Patient to follow-up on his recent groin pain and bilateral bulges.  He states that he currently has no groin pain at this time.  The bulges in his groin have slightly decreased in size, but denies them bulging or increasing in size at any times.  He is tolerating a diet without nausea and vomiting, and moving his bowels without issue.  At this time, the patient would like to monitor the areas and not proceed with any further workup.  I explained that this is very reasonable, as these bulges are likely fluid collections related to the surgery which will slowly improve with time.  Patient will call the office if he has worsening pain or issues.  We discussed that this is a telephone encounter and that he may receive a bill regarding this encounter.  All questions were answered to his expressed satisfaction.  Matthew Kinds, DO [facility] [address] [place], [place] [address] [phone] (office)

## 2023-02-13 ENCOUNTER — Other Ambulatory Visit (HOSPITAL_COMMUNITY): Payer: Self-pay

## 2023-02-26 ENCOUNTER — Encounter: Payer: Self-pay | Admitting: *Deleted

## 2023-03-13 ENCOUNTER — Other Ambulatory Visit: Payer: Self-pay

## 2023-04-10 ENCOUNTER — Other Ambulatory Visit (HOSPITAL_COMMUNITY): Payer: Self-pay

## 2023-05-05 ENCOUNTER — Other Ambulatory Visit (HOSPITAL_COMMUNITY): Payer: Self-pay

## 2023-06-13 ENCOUNTER — Other Ambulatory Visit (HOSPITAL_COMMUNITY): Payer: Self-pay

## 2023-06-30 ENCOUNTER — Other Ambulatory Visit (HOSPITAL_COMMUNITY): Payer: Self-pay

## 2023-06-30 MED ORDER — EMPAGLIFLOZIN 25 MG PO TABS
25.0000 mg | ORAL_TABLET | Freq: Every day | ORAL | 4 refills | Status: AC
  Filled 2023-06-30: qty 30, 30d supply, fill #0
  Filled 2023-08-04: qty 30, 30d supply, fill #1
  Filled 2023-08-31: qty 30, 30d supply, fill #2
  Filled 2023-09-28: qty 30, 30d supply, fill #3
  Filled 2023-10-30: qty 30, 30d supply, fill #4
  Filled 2023-11-28: qty 30, 30d supply, fill #5
  Filled 2023-12-26 (×2): qty 30, 30d supply, fill #6
  Filled 2024-01-23: qty 30, 30d supply, fill #7
  Filled 2024-02-17: qty 30, 30d supply, fill #8
  Filled 2024-03-11 – 2024-03-12 (×2): qty 30, 30d supply, fill #9
  Filled 2024-04-19: qty 30, 30d supply, fill #10
  Filled 2024-05-13: qty 30, 30d supply, fill #11

## 2023-07-15 ENCOUNTER — Other Ambulatory Visit (HOSPITAL_COMMUNITY): Payer: Self-pay

## 2023-08-04 ENCOUNTER — Other Ambulatory Visit (HOSPITAL_COMMUNITY): Payer: Self-pay

## 2023-08-13 ENCOUNTER — Other Ambulatory Visit (HOSPITAL_COMMUNITY): Payer: Self-pay

## 2023-09-02 ENCOUNTER — Other Ambulatory Visit (HOSPITAL_COMMUNITY): Payer: Self-pay

## 2023-09-08 ENCOUNTER — Other Ambulatory Visit (HOSPITAL_COMMUNITY): Payer: Self-pay

## 2023-09-29 ENCOUNTER — Other Ambulatory Visit (HOSPITAL_COMMUNITY): Payer: Self-pay

## 2023-10-06 ENCOUNTER — Other Ambulatory Visit (HOSPITAL_COMMUNITY): Payer: Self-pay

## 2023-10-30 ENCOUNTER — Other Ambulatory Visit (HOSPITAL_COMMUNITY): Payer: Self-pay

## 2023-10-30 MED ORDER — TRULICITY 1.5 MG/0.5ML ~~LOC~~ SOAJ
1.5000 mg | SUBCUTANEOUS | 4 refills | Status: AC
  Filled 2023-10-30: qty 6, 84d supply, fill #0
  Filled 2023-11-03: qty 2, 28d supply, fill #0
  Filled 2023-11-28: qty 2, 28d supply, fill #1
  Filled 2023-12-26 (×2): qty 2, 28d supply, fill #2
  Filled 2024-01-23: qty 2, 28d supply, fill #3
  Filled 2024-02-17: qty 2, 28d supply, fill #4
  Filled 2024-03-11: qty 2, 28d supply, fill #5
  Filled 2024-04-19: qty 2, 28d supply, fill #6
  Filled 2024-05-13: qty 2, 28d supply, fill #7

## 2023-11-03 ENCOUNTER — Other Ambulatory Visit (HOSPITAL_COMMUNITY): Payer: Self-pay

## 2023-11-28 ENCOUNTER — Other Ambulatory Visit (HOSPITAL_COMMUNITY): Payer: Self-pay

## 2023-12-26 ENCOUNTER — Other Ambulatory Visit (HOSPITAL_COMMUNITY): Payer: Self-pay

## 2024-01-23 ENCOUNTER — Other Ambulatory Visit (HOSPITAL_COMMUNITY): Payer: Self-pay

## 2024-02-15 ENCOUNTER — Other Ambulatory Visit (HOSPITAL_COMMUNITY): Payer: Self-pay

## 2024-02-17 ENCOUNTER — Other Ambulatory Visit (HOSPITAL_COMMUNITY): Payer: Self-pay

## 2024-03-11 ENCOUNTER — Other Ambulatory Visit (HOSPITAL_COMMUNITY): Payer: Self-pay

## 2024-03-12 ENCOUNTER — Other Ambulatory Visit (HOSPITAL_COMMUNITY): Payer: Self-pay

## 2024-04-06 ENCOUNTER — Other Ambulatory Visit (HOSPITAL_COMMUNITY): Payer: Self-pay

## 2024-04-19 ENCOUNTER — Other Ambulatory Visit (HOSPITAL_COMMUNITY): Payer: Self-pay

## 2024-05-13 ENCOUNTER — Other Ambulatory Visit (HOSPITAL_COMMUNITY): Payer: Self-pay
